# Patient Record
Sex: Female | Born: 1995 | Race: Black or African American | Hispanic: No | Marital: Single | Smoking: Current some day smoker
Health system: Southern US, Community
[De-identification: ages and names within clinical notes are randomized; demographics above are authoritative.]

## PROBLEM LIST (undated history)

## (undated) DIAGNOSIS — A549 Gonococcal infection, unspecified: Secondary | ICD-10-CM

## (undated) DIAGNOSIS — A749 Chlamydial infection, unspecified: Secondary | ICD-10-CM

## (undated) HISTORY — PX: LEG SURGERY: SHX1003

---
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
## 2015-05-21 ENCOUNTER — Emergency Department (HOSPITAL_COMMUNITY)
Admission: EM | Admit: 2015-05-21 | Discharge: 2015-05-21 | Disposition: A | Discharge status: Home / Self Care | Payer: Medicaid Other | Source: Home / Self Care

## 2015-12-11 ENCOUNTER — Emergency Department (HOSPITAL_COMMUNITY)
Admission: EM | Admit: 2015-12-11 | Discharge: 2015-12-11 | Disposition: A | Discharge status: Home / Self Care | Payer: Medicaid Other | Attending: Emergency Medicine | Admitting: Emergency Medicine

## 2015-12-11 ENCOUNTER — Emergency Department (HOSPITAL_COMMUNITY): Payer: Medicaid Other

## 2015-12-11 ENCOUNTER — Encounter (HOSPITAL_COMMUNITY): Payer: Self-pay | Admitting: *Deleted

## 2015-12-11 DIAGNOSIS — R1032 Left lower quadrant pain: Secondary | ICD-10-CM | POA: Diagnosis present

## 2015-12-11 DIAGNOSIS — N83201 Unspecified ovarian cyst, right side: Secondary | ICD-10-CM | POA: Diagnosis not present

## 2015-12-11 DIAGNOSIS — R102 Pelvic and perineal pain: Secondary | ICD-10-CM

## 2015-12-11 LAB — COMPREHENSIVE METABOLIC PANEL
ALBUMIN: 4.5 g/dL (ref 3.5–5.0)
ALT: 22 U/L (ref 14–54)
AST: 23 U/L (ref 15–41)
Alkaline Phosphatase: 65 U/L (ref 38–126)
Anion gap: 7 (ref 5–15)
BUN: 10 mg/dL (ref 6–20)
CHLORIDE: 106 mmol/L (ref 101–111)
CO2: 24 mmol/L (ref 22–32)
CREATININE: 0.91 mg/dL (ref 0.44–1.00)
Calcium: 9.6 mg/dL (ref 8.9–10.3)
GFR calc non Af Amer: >60 mL/min (ref >60)
GLUCOSE: 111 mg/dL — AB (ref 65–99)
Potassium: 4.3 mmol/L (ref 3.5–5.1)
SODIUM: 137 mmol/L (ref 135–145)
Total Bilirubin: 0.8 mg/dL (ref 0.3–1.2)
Total Protein: 7.6 g/dL (ref 6.5–8.1)

## 2015-12-11 LAB — URINALYSIS, ROUTINE W REFLEX MICROSCOPIC
GLUCOSE, UA: NEGATIVE mg/dL
HGB URINE DIPSTICK: NEGATIVE
Ketones, ur: NEGATIVE mg/dL
Nitrite: NEGATIVE
Protein, ur: NEGATIVE mg/dL
SPECIFIC GRAVITY, URINE: 1.031 — AB (ref 1.005–1.030)
pH: 6 (ref 5.0–8.0)

## 2015-12-11 LAB — CBC
HCT: 40.8 % (ref 36.0–46.0)
Hemoglobin: 13.6 g/dL (ref 12.0–15.0)
MCH: 27.3 pg (ref 26.0–34.0)
MCHC: 33.3 g/dL (ref 30.0–36.0)
MCV: 81.8 fL (ref 78.0–100.0)
PLATELETS: 284 10*3/uL (ref 150–400)
RBC: 4.99 MIL/uL (ref 3.87–5.11)
RDW: 14.1 % (ref 11.5–15.5)
WBC: 9.6 10*3/uL (ref 4.0–10.5)

## 2015-12-11 LAB — I-STAT BETA HCG BLOOD, ED (MC, WL, AP ONLY): I-stat hCG, quantitative: <5 m[IU]/mL (ref <5)

## 2015-12-11 LAB — LIPASE, BLOOD: LIPASE: 20 U/L (ref 11–51)

## 2015-12-11 LAB — URINE MICROSCOPIC-ADD ON: RBC / HPF: NONE SEEN RBC/hpf (ref 0–5)

## 2015-12-11 MED ORDER — IBUPROFEN 600 MG PO TABS: 600.0000 mg | ORAL_TABLET | Freq: Four times a day (QID) | ORAL | 0 refills | Status: DC | PRN

## 2015-12-11 MED ORDER — HYDROCODONE-ACETAMINOPHEN 5-325 MG PO TABS: 1.0000 | ORAL_TABLET | ORAL | 0 refills | Status: DC | PRN

## 2015-12-11 NOTE — ED Notes (Signed)
Pt ambulatory and independent at discharge.  Verbalized understanding of discharge instructions 

## 2015-12-11 NOTE — ED Provider Notes (Signed)
[facility]-EMERGENCY DEPT Provider Note   CSN: [id number] Arrival date & time: 12/11/15  1421     History   Chief Complaint Chief Complaint  Patient presents with  . Abdominal Pain    HPI Rachel Lamb is a 20 y.o. female.  20 year old female presents acute onset of left lower quadrant abdominal pain was last for approximately 2 hours. Pain characterized as sharp and nonradiating. No associated vaginal bleeding or discharge. Denies any flank pain. No hematuria or dysuria. No diarrhea noted. Did pass gas which did not seem to relieve her symptoms. Denies history of constipation. Symptoms resolve spontaneously. Feels better at this time      History reviewed. No pertinent past medical history.  There are no active problems to display for this patient.   Past Surgical History:  Procedure Laterality Date  . LEG SURGERY      OB History    No data available       Home Medications    Prior to Admission medications   Medication Sig Start Date End Date Taking? Authorizing Provider  methylphenidate (CONCERTA) 36 MG CR tablet Take 36 mg by mouth every morning.    Leata MouseJanardhana Jonnalagadda, MD    Family History No family history on file.  Social History Social History  Substance Use Topics  . Smoking status: Never Smoker  . Smokeless tobacco: Never Used  . Alcohol use No     Allergies   Iodine   Review of Systems Review of Systems  All other systems reviewed and are negative.    Physical Exam Updated Vital Signs BP 124/69 (BP Location: Right Arm)   Pulse 86   Temp 97.9 F (36.6 C) (Oral)   Resp 18   LMP 11/19/2015   SpO2 100%   Physical Exam  Constitutional: She is oriented to person, place, and time. She appears well-developed and well-nourished.  Non-toxic appearance. No distress.  HENT:  Head: Normocephalic and atraumatic.  Eyes: Conjunctivae, EOM and lids are normal. Pupils are equal, round, and reactive to light.  Neck: Normal range of motion. Neck  supple. No tracheal deviation present. No thyroid mass present.  Cardiovascular: Normal rate, regular rhythm and normal heart sounds.  Exam reveals no gallop.   No murmur heard. Pulmonary/Chest: Effort normal and breath sounds normal. No stridor. No respiratory distress. She has no decreased breath sounds. She has no wheezes. She has no rhonchi. She has no rales.  Abdominal: Soft. Normal appearance and bowel sounds are normal. She exhibits no distension. There is tenderness in the left lower quadrant. There is no rigidity, no rebound, no guarding and no CVA tenderness.    Musculoskeletal: Normal range of motion. She exhibits no edema or tenderness.  Neurological: She is alert and oriented to person, place, and time. She has normal strength. No cranial nerve deficit or sensory deficit. GCS eye subscore is 4. GCS verbal subscore is 5. GCS motor subscore is 6.  Skin: Skin is warm and dry. No abrasion and no rash noted.  Psychiatric: She has a normal mood and affect. Her speech is normal and behavior is normal.  Nursing note and vitals reviewed.    ED Treatments / Results  Labs (all labs ordered are listed, but only abnormal results are displayed) Labs Reviewed  COMPREHENSIVE METABOLIC PANEL - Abnormal; Notable for the following:       Result Value   Glucose, Bld 111 (*)    All other components within normal limits  URINE CULTURE  LIPASE, BLOOD  CBC  URINALYSIS, ROUTINE W REFLEX MICROSCOPIC (NOT AT [facility])  I-STAT BETA HCG BLOOD, ED ([facility], [facility], [facility] ONLY)    EKG  EKG Interpretation None       Radiology No results found.  Procedures Procedures (including critical care time)  Medications Ordered in ED Medications - No data to display   Initial Impression / Assessment and Plan / ED Course  I have reviewed the triage vital signs and the nursing notes.  Pertinent labs & imaging results that were available during my care of the patient were reviewed by me and considered in my medical  decision making (see chart for details).  Clinical Course    Patient's ultrasound consistent with ruptured ovarian cyst. She is deferring a pelvic exam at this time. Will be given Gyn referral  Final Clinical Impressions(s) / ED Diagnoses   Final diagnoses:  None    New Prescriptions New Prescriptions   No medications on file     Lorre Nick, MD 12/11/15 2208

## 2015-12-11 NOTE — ED Triage Notes (Signed)
Per ems, pt complains of lower abdominal pain since this morning. Pt has not had diarrhea/emesis. Pt states she has had lack of appetite today.

## 2015-12-13 LAB — URINE CULTURE

## 2016-01-05 ENCOUNTER — Emergency Department (HOSPITAL_COMMUNITY)
Admission: EM | Admit: 2016-01-05 | Discharge: 2016-01-06 | Disposition: A | Discharge status: Home / Self Care | Payer: Medicaid Other | Attending: Emergency Medicine | Admitting: Emergency Medicine

## 2016-01-05 ENCOUNTER — Encounter (HOSPITAL_COMMUNITY): Payer: Self-pay | Admitting: Oncology

## 2016-01-05 DIAGNOSIS — R140 Abdominal distension (gaseous): Secondary | ICD-10-CM | POA: Insufficient documentation

## 2016-01-05 DIAGNOSIS — R1011 Right upper quadrant pain: Secondary | ICD-10-CM | POA: Diagnosis present

## 2016-01-05 DIAGNOSIS — Z79899 Other long term (current) drug therapy: Secondary | ICD-10-CM | POA: Insufficient documentation

## 2016-01-05 LAB — COMPREHENSIVE METABOLIC PANEL
ALK PHOS: 72 U/L (ref 38–126)
ALT: 20 U/L (ref 14–54)
AST: 21 U/L (ref 15–41)
Albumin: 3.9 g/dL (ref 3.5–5.0)
Anion gap: 10 (ref 5–15)
BUN: 9 mg/dL (ref 6–20)
CHLORIDE: 103 mmol/L (ref 101–111)
CO2: 23 mmol/L (ref 22–32)
CREATININE: 0.86 mg/dL (ref 0.44–1.00)
Calcium: 9.2 mg/dL (ref 8.9–10.3)
GFR calc Af Amer: >60 mL/min (ref >60)
Glucose, Bld: 120 mg/dL — ABNORMAL HIGH (ref 65–99)
Potassium: 4 mmol/L (ref 3.5–5.1)
Sodium: 136 mmol/L (ref 135–145)
Total Bilirubin: 0.9 mg/dL (ref 0.3–1.2)
Total Protein: 7.6 g/dL (ref 6.5–8.1)

## 2016-01-05 LAB — CBC
HCT: 32.6 % — ABNORMAL LOW (ref 36.0–46.0)
Hemoglobin: 11.9 g/dL — ABNORMAL LOW (ref 12.0–15.0)
MCH: 28.6 pg (ref 26.0–34.0)
MCHC: 36.5 g/dL — ABNORMAL HIGH (ref 30.0–36.0)
MCV: 78.4 fL (ref 78.0–100.0)
PLATELETS: 595 10*3/uL — AB (ref 150–400)
RBC: 4.16 MIL/uL (ref 3.87–5.11)
RDW: 14.1 % (ref 11.5–15.5)
WBC: 29.9 10*3/uL — AB (ref 4.0–10.5)

## 2016-01-05 LAB — URINALYSIS, ROUTINE W REFLEX MICROSCOPIC
GLUCOSE, UA: NEGATIVE mg/dL
Nitrite: NEGATIVE
PROTEIN: 30 mg/dL — AB
Specific Gravity, Urine: 1.028 (ref 1.005–1.030)
pH: 6 (ref 5.0–8.0)

## 2016-01-05 LAB — LIPASE, BLOOD: LIPASE: 17 U/L (ref 11–51)

## 2016-01-05 LAB — URINE MICROSCOPIC-ADD ON

## 2016-01-05 NOTE — ED Triage Notes (Signed)
Per [facility] pt c/o RUQ abdominal pain since yesterday.  +Nausea.  Pt ambulatory to triage.  A&O x 4.

## 2016-01-05 NOTE — ED Provider Notes (Signed)
[facility]-EMERGENCY DEPT Provider Note   CSN: [id number] Arrival date & time: 01/05/16  1958  By signing my name below, I, Linna Darner, attest that this documentation has been prepared under the direction and in the presence [employer], PA-C. Electronically Signed: Linna Darner, Scribe. 01/06/2016. 12:13 AM.  History   Chief Complaint Chief Complaint  Patient presents with  . Abdominal Pain    The history is provided by the patient. No language interpreter was used.    HPI Comments: Rachel Lamb is a 20 y.o. female who presents to the Emergency Department complaining of sudden onset, constant, RUQ abdominal pain beginning 2 days ago. She reports her pain became severe yesterday. Pt reports her pain is worse with deep inhalation, ambulation, laying down, and palpation to her RUQ. Pt denies pain exacerbation with eating. She notes some pain alleviation with sitting upright. She notes she last had a BM two days ago and endorses abdominal pain exacerbation when she tries to have a BM. She states she usually has regular BM's. Pt has tried resting with no relief of her pain. She also notes she has used hydrocodone and ibuprofen but regurgitated the medications; she had these medications leftover from a visit for ovarian cysts and notes her current pain does not feel like ovarian cysts. Pt states she did not eat yesterday. Her LMP was at the beginning of the month. She denies h/o abdominal surgery. She denies recent heavy lifting. Pt further denies dysuria, hematuria, fever, vaginal pain, vaginal discharge, or any other associated symptoms.  History reviewed. No pertinent past medical history.  There are no active problems to display for this patient.   Past Surgical History:  Procedure Laterality Date  . LEG SURGERY      OB History    No data available       Home Medications    Prior to Admission medications   Medication Sig Start Date End Date Taking? Authorizing Provider    HYDROcodone-acetaminophen (NORCO/VICODIN) 5-325 MG tablet Take 1-2 tablets by mouth every 4 (four) hours as needed. 12/11/15  Yes Lorre Nick, MD  ibuprofen (ADVIL,MOTRIN) 600 MG tablet Take 1 tablet (600 mg total) by mouth every 6 (six) hours as needed. 12/11/15  Yes Lorre Nick, MD  polyethylene glycol powder (GLYCOLAX/MIRALAX) powder Take 17 g by mouth daily. Until daily soft stools  OTC 01/06/16   Antony Madura, PA-C    Family History No family history on file.  Social History Social History  Substance Use Topics  . Smoking status: Never Smoker  . Smokeless tobacco: Never Used  . Alcohol use No     Allergies   Iodine   Review of Systems Review of Systems A complete 10 system review of systems was obtained and all systems are negative except as noted in the HPI and PMH.    Physical Exam Updated Vital Signs BP 105/69   Pulse 84   Temp 98.5 F (36.9 C) (Oral)   Resp 18   Ht 4\' 11"  (1.499 m)   Wt 61.2 kg   LMP 12/31/2015 (Approximate) Comment: negative pregnancy test result.  SpO2 100%   BMI 27.27 kg/m   Physical Exam  Constitutional: She is oriented to person, place, and time. She appears well-developed and well-nourished. No distress.  Nontoxic appearing and in NAD  HENT:  Head: Normocephalic and atraumatic.  Eyes: Conjunctivae and EOM are normal. No scleral icterus.  Neck: Normal range of motion.  Cardiovascular: Normal rate, regular rhythm and intact distal pulses.  Pulmonary/Chest: Effort normal. No respiratory distress. She has no wheezes.  Respirations even and unlabored.  Abdominal: She exhibits distension. There is tenderness. There is guarding.  Mildly distended abdomen with diffuse TTP, though appreciate more significant TTP in the RUQ. There is diffuse voluntary guarding. No masses palpable.  Musculoskeletal: Normal range of motion.  Neurological: She is alert and oriented to person, place, and time. She exhibits normal muscle tone. Coordination  normal.  Skin: Skin is warm and dry. No rash noted. She is not diaphoretic. No erythema. No pallor.  Psychiatric: She has a normal mood and affect. Her behavior is normal.  Nursing note and vitals reviewed.    ED Treatments / Results  Labs (all labs ordered are listed, but only abnormal results are displayed) Labs Reviewed  COMPREHENSIVE METABOLIC PANEL - Abnormal; Notable for the following:       Result Value   Glucose, Bld 120 (*)    All other components within normal limits  CBC - Abnormal; Notable for the following:    WBC 29.9 (*)    Hemoglobin 11.9 (*)    HCT 32.6 (*)    MCHC 36.5 (*)    Platelets 595 (*)    All other components within normal limits  URINALYSIS, ROUTINE W REFLEX MICROSCOPIC (NOT AT [facility]) - Abnormal; Notable for the following:    Color, Urine AMBER (*)    APPearance CLOUDY (*)    Hgb urine dipstick SMALL (*)    Bilirubin Urine SMALL (*)    Ketones, ur >80 (*)    Protein, ur 30 (*)    Leukocytes, UA MODERATE (*)    All other components within normal limits  URINE MICROSCOPIC-ADD ON - Abnormal; Notable for the following:    Squamous Epithelial / LPF 0-5 (*)    Bacteria, UA FEW (*)    All other components within normal limits  LIPASE, BLOOD  PREGNANCY, URINE    EKG  EKG Interpretation None       Radiology Ct Abdomen Pelvis Wo Contrast  Result Date: 01/06/2016 CLINICAL DATA:  Sudden onset right upper quadrant abdominal pain for 2 days. EXAM: CT ABDOMEN AND PELVIS WITHOUT CONTRAST TECHNIQUE: Multidetector CT imaging of the abdomen and pelvis was performed following the standard protocol without IV contrast. COMPARISON:  None. FINDINGS: Lower chest: Trace bilateral pleural effusions. Lung bases are clear. Hepatobiliary: Unenhanced appearance is unremarkable. Pancreas: Unenhanced appearance is unremarkable. Spleen: Unenhanced appearance is unremarkable. Adrenals/Urinary Tract: Adrenal glands are unremarkable. Kidneys are normal, without renal calculi,  focal lesion, or hydronephrosis. Bladder is unremarkable. Stomach/Bowel: Stomach, small bowel, and colon are not abnormally distended. Most of the contrast material remains in the stomach, possibly indicating decreased motility. No inflammatory changes suggested. Appendix is normal. Vascular/Lymphatic: No significant vascular findings are present. No enlarged abdominal or pelvic lymph nodes. Reproductive: Uterus and bilateral adnexa are unremarkable. Other: No abdominal wall hernia or abnormality. No abdominopelvic ascites. Musculoskeletal: No acute or significant osseous findings. IMPRESSION: No acute process demonstrated in the abdomen or pelvis. No evidence of bowel obstruction or inflammation. Most of the oral contrast material remains in the stomach, possibly indicating hypomotility. Trace bilateral pleural effusions. Electronically Signed   By: Burman NievesWilliam  Stevens M.D.   On: 01/06/2016 02:23   Dg Abd 1 View  Result Date: 01/06/2016 CLINICAL DATA:  Right upper quadrant pain, nausea and vomiting since last night. Ovarian cyst rupture 2-3 weeks ago. EXAM: ABDOMEN - 1 VIEW COMPARISON:  None. FINDINGS: Gas throughout the colon without distention. No  small bowel distention. No free intra-abdominal air. No abnormal air-fluid levels. No radiopaque stones. Visualized bones appear intact. IMPRESSION: Nonobstructive bowel gas pattern. Electronically Signed   By: Burman Nieves M.D.   On: 01/06/2016 01:32   US Abdomen Limited Ruq  Result Date: 01/06/2016 CLINICAL DATA:  Right upper quadrant pain for 2 days. EXAM: US ABDOMEN LIMITED - RIGHT UPPER QUADRANT COMPARISON:  CT 01/06/2016 FINDINGS: Gallbladder: No gallstones or wall thickening visualized. No sonographic Murphy sign noted by sonographer. Common bile duct: Diameter: 2.9 mm, normal Liver: No focal lesion identified. Within normal limits in parenchymal echogenicity. IMPRESSION: Normal examination. Electronically Signed   By: Burman Nieves M.D.   On:  01/06/2016 03:20    Procedures Procedures (including critical care time)  DIAGNOSTIC STUDIES: Oxygen Saturation is 99% on RA, normal by my interpretation.    COORDINATION OF CARE: 12:20 AM Discussed treatment plan with pt at bedside and pt agreed to plan.  Medications Ordered in ED Medications  sodium chloride 0.9 % bolus 1,000 mL (0 mLs Intravenous Stopped 01/06/16 0233)  ondansetron (ZOFRAN) injection 4 mg (4 mg Intravenous Given 01/06/16 0048)  morphine 4 MG/ML injection 4 mg (4 mg Intravenous Given 01/06/16 0048)  iopamidol (ISOVUE-300) 61 % injection 15 mL (15 mLs Oral Contrast Given 01/06/16 0051)     Initial Impression / Assessment and Plan / ED Course  I have reviewed the triage vital signs and the nursing notes.  Pertinent labs & imaging results that were available during my care of the patient were reviewed by me and considered in my medical decision making (see chart for details).  Clinical Course    20 y/o female presents for abdominal pain. Initial exam of the abdomen concerning; diffuse TTP appreciated as well as voluntary guarding. Mild distension noted. Patient afebrile, but with leukocytosis of 29.9. Given exam and white count, CT ordered to evaluate etiology of symptoms. Unable to do contrasted study secondary to contrast allergy. No abnormalities appreciated on oral contrasted CT study. [place] was subsequently obtained given increased TTP in the RUQ. This was negative for acute or surgical gallbladder etiology.  On reassessment, patient states that she is feeling better. She states that she passed a large amount of gas and this significantly improved her discomfort. Patient with stable vital signs. No SIRS or Sepsis criteria. She is nontoxic appearing. Given reassuring imaging, plan for discharge and outpatient management. Will start on Miralax. Return precautions discussed and provided. Patient discharged in stable condition with no unaddressed concerns. Patient seen and  examined, also, by my attending, Dr. Read Drivers, who is in agreement with this work up, assessment, management plan, and patient's stability for discharge.   Vitals:   01/06/16 0300 01/06/16 0330 01/06/16 0400 01/06/16 0430  BP: 108/65 120/69 104/66 105/69  Pulse: 89 88 80 84  Resp:      Temp:      TempSrc:      SpO2: 98% 97% 99% 100%  Weight:      Height:        Final Clinical Impressions(s) / ED Diagnoses   Final diagnoses:  Right upper quadrant abdominal pain    New Prescriptions Discharge Medication List as of 01/06/2016  4:23 AM    START taking these medications   Details  polyethylene glycol powder (GLYCOLAX/MIRALAX) powder Take 17 g by mouth daily. Until daily soft stools  OTC, Starting Sat 01/06/2016, Print        I personally performed the services described in this documentation, which was  scribed in my presence. The recorded information has been reviewed and is accurate.      Antony Madura, PA-C 01/09/16 1610    Paula Libra, MD 01/09/16 [phone]

## 2016-01-06 ENCOUNTER — Emergency Department (HOSPITAL_COMMUNITY): Payer: Medicaid Other

## 2016-01-06 LAB — PREGNANCY, URINE: PREG TEST UR: NEGATIVE

## 2016-01-06 MED ORDER — POLYETHYLENE GLYCOL 3350 17 GM/SCOOP PO POWD: 17.0000 g | Freq: Every day | ORAL | 0 refills | Status: DC

## 2016-01-06 MED ORDER — SODIUM CHLORIDE 0.9 % IV BOLUS (SEPSIS)
1000.0000 mL | Freq: Once | INTRAVENOUS | Status: AC
  Administered 2016-01-06: 1000 mL via INTRAVENOUS

## 2016-01-06 MED ORDER — MORPHINE SULFATE (PF) 4 MG/ML IV SOLN
4.0000 mg | Freq: Once | INTRAVENOUS | Status: AC
Start: 2016-01-06 — End: 2016-01-06
  Administered 2016-01-06: 4 mg via INTRAVENOUS
  Filled 2016-01-06: qty 1

## 2016-01-06 MED ORDER — ONDANSETRON HCL 4 MG/2ML IJ SOLN
4.0000 mg | Freq: Once | INTRAMUSCULAR | Status: AC
  Administered 2016-01-06: 4 mg via INTRAVENOUS
  Filled 2016-01-06: qty 2

## 2016-01-06 MED ORDER — IOPAMIDOL (ISOVUE-300) INJECTION 61%
15.0000 mL | Freq: Once | INTRAVENOUS | Status: AC | PRN
Start: 2016-01-06 — End: 2016-01-06
  Administered 2016-01-06: 15 mL via ORAL

## 2016-02-12 ENCOUNTER — Ambulatory Visit (HOSPITAL_COMMUNITY)
Admission: EM | Admit: 2016-02-12 | Discharge: 2016-02-12 | Disposition: A | Discharge status: Home / Self Care | Payer: Medicaid Other | Attending: Emergency Medicine | Admitting: Emergency Medicine

## 2016-02-12 ENCOUNTER — Encounter (HOSPITAL_COMMUNITY): Payer: Self-pay | Admitting: Emergency Medicine

## 2016-02-12 DIAGNOSIS — R10813 Right lower quadrant abdominal tenderness: Secondary | ICD-10-CM | POA: Insufficient documentation

## 2016-02-12 DIAGNOSIS — Z202 Contact with and (suspected) exposure to infections with a predominantly sexual mode of transmission: Secondary | ICD-10-CM | POA: Diagnosis present

## 2016-02-12 DIAGNOSIS — R102 Pelvic and perineal pain: Secondary | ICD-10-CM

## 2016-02-12 DIAGNOSIS — R1021 Pelvic and perineal pain right side: Secondary | ICD-10-CM

## 2016-02-12 LAB — POCT URINALYSIS DIP (DEVICE)
BILIRUBIN URINE: NEGATIVE
Glucose, UA: NEGATIVE mg/dL
Nitrite: NEGATIVE
Protein, ur: 30 mg/dL — AB
Urobilinogen, UA: 0.2 mg/dL (ref 0.0–1.0)
pH: 6 (ref 5.0–8.0)

## 2016-02-12 LAB — POCT PREGNANCY, URINE: Preg Test, Ur: NEGATIVE

## 2016-02-12 MED ORDER — LIDOCAINE HCL (PF) 1 % IJ SOLN
INTRAMUSCULAR | Status: AC
  Filled 2016-02-12: qty 2

## 2016-02-12 MED ORDER — AZITHROMYCIN 250 MG PO TABS
ORAL_TABLET | ORAL | Status: AC
  Filled 2016-02-12: qty 4

## 2016-02-12 MED ORDER — CEFTRIAXONE SODIUM 250 MG IJ SOLR
INTRAMUSCULAR | Status: AC
  Filled 2016-02-12: qty 250

## 2016-02-12 MED ORDER — CEFTRIAXONE SODIUM 250 MG IJ SOLR
250.0000 mg | Freq: Once | INTRAMUSCULAR | Status: AC
  Administered 2016-02-12: 250 mg via INTRAMUSCULAR

## 2016-02-12 MED ORDER — AZITHROMYCIN 250 MG PO TABS
1000.0000 mg | ORAL_TABLET | Freq: Once | ORAL | Status: AC
  Administered 2016-02-12: 1000 mg via ORAL

## 2016-02-12 NOTE — ED Triage Notes (Signed)
Pain under ribs-seen in emergency department.  Pain with any movement.  Told "backed up with gas" per patient.  Patient wants preg test.  And patient was told she needed to be checked for std.  Denies vaginal discharge, denies painful urinations

## 2016-02-12 NOTE — ED Provider Notes (Signed)
CSN: [id number]     Arrival date & time 02/12/16  1310 History   None    Chief Complaint  Patient presents with  . SEXUALLY TRANSMITTED DISEASE   (Consider location/radiation/quality/duration/timing/severity/associated sxs/prior Treatment) 20 year old female states that she was advised by her boyfriend that he tested positive for unknown type STD. She wants to be checked for STD as well as have a pregnancy test. She believes her LMP was sometime in October but she does not really keep track of it. She is currently asymptomatic. She has no complaints. Her pregnancy test is currently negative. She denies urinary symptoms.      History reviewed. No pertinent past medical history. Past Surgical History:  Procedure Laterality Date  . LEG SURGERY     No family history on file. Social History  Substance Use Topics  . Smoking status: Never Smoker  . Smokeless tobacco: Never Used  . Alcohol use No   OB History    No data available     Review of Systems  Constitutional: Negative.   HENT: Negative.   Respiratory: Negative.   Cardiovascular: Negative.   Gastrointestinal: Negative.   Genitourinary: Negative.   Musculoskeletal: Negative.     Allergies  Iodine  Home Medications   Prior to Admission medications   Medication Sig Start Date End Date Taking? Authorizing Provider  HYDROcodone-acetaminophen (NORCO/VICODIN) 5-325 MG tablet Take 1-2 tablets by mouth every 4 (four) hours as needed. 12/11/15   Lorre NickAnthony Allen, MD  ibuprofen (ADVIL,MOTRIN) 600 MG tablet Take 1 tablet (600 mg total) by mouth every 6 (six) hours as needed. 12/11/15   Lorre NickAnthony Allen, MD  polyethylene glycol powder (GLYCOLAX/MIRALAX) powder Take 17 g by mouth daily. Until daily soft stools  OTC 01/06/16   Antony MaduraKelly Humes, PA-C   Meds Ordered and Administered this Visit   Medications  cefTRIAXone (ROCEPHIN) injection 250 mg (not administered)  azithromycin (ZITHROMAX) tablet 1,000 mg (not administered)    BP  101/59 (BP Location: Left Arm)   Pulse 84   Temp 98.4 F (36.9 C) (Oral)   Resp 16   LMP 01/19/2016   SpO2 99%  No data found.   Physical Exam  Constitutional: She is oriented to person, place, and time. She appears well-developed and well-nourished. No distress.  Eyes: EOM are normal.  Neck: Normal range of motion. Neck supple.  Cardiovascular: Normal rate.   Pulmonary/Chest: Effort normal. No respiratory distress.  Genitourinary: Vagina normal.  Genitourinary Comments: Normal external female genitalia. Scant thin gray discharge. Cervix left of midline. Ectocervix pink without lesions. Negative CMT. Positive for right adnexal tenderness. Negative for left adnexal tenderness.  Musculoskeletal: She exhibits no edema.  Neurological: She is alert and oriented to person, place, and time. She exhibits normal muscle tone.  Skin: Skin is warm and dry.  Psychiatric: She has a normal mood and affect.  Nursing note and vitals reviewed.   Urgent Care Course   Clinical Course     Procedures (including critical care time)  Labs Review Labs Reviewed  POCT URINALYSIS DIP (DEVICE) - Abnormal; Notable for the following:       Result Value   Ketones, ur TRACE (*)    Hgb urine dipstick SMALL (*)    Protein, ur 30 (*)    Leukocytes, UA SMALL (*)    All other components within normal limits  POCT PREGNANCY, URINE  CERVICOVAGINAL ANCILLARY ONLY    Imaging Review No results found.   Visual Acuity Review  Right Eye Distance:  Left Eye Distance:   Bilateral Distance:    Right Eye Near:   Left Eye Near:    Bilateral Near:         MDM   1. Exposure to sexually transmitted disease (STD)   2. Adnexal tenderness, right    For any positives that return on the swab test he will be called and treated appropriately, usually over the telephone. Today you have received medicines called Rocephin and azithromycin which treat gonorrhea and chlamydia. Meds ordered this encounter   Medications  . cefTRIAXone (ROCEPHIN) injection 250 mg  . azithromycin (ZITHROMAX) tablet 1,000 mg  Elected not to treat him. Clean for possible BV due to such a scant amount of discharge and otherwise asymptomatic with the exception of right adnexal tenderness. Treat with the above medications. Cervical cytology pending.     Hayden Rasmussenavid Aden Sek, NP 02/12/16 [phone]

## 2016-02-12 NOTE — Discharge Instructions (Signed)
For any positives that return on the swab test he will be called and treated appropriately, usually over the telephone. Today you have received medicines called Rocephin and azithromycin which treat gonorrhea and chlamydia.

## 2016-02-13 LAB — CERVICOVAGINAL ANCILLARY ONLY
CHLAMYDIA, DNA PROBE: POSITIVE — AB
NEISSERIA GONORRHEA: POSITIVE — AB
WET PREP (BD AFFIRM): POSITIVE — AB

## 2016-02-16 ENCOUNTER — Telehealth (HOSPITAL_COMMUNITY): Payer: Self-pay | Admitting: Emergency Medicine

## 2016-02-16 NOTE — Telephone Encounter (Signed)
Called pt and notified of recent lab results Pt ID'd properly... Reports feeling better but still having some vaginal irritation Per her request, called in Flagyl to [facility] ([address]) Adv pt if sx are not getting better to return or to f/u w/PCP Education on safe sex given Also adv pt to notify partner(s) [facility] Pt verb understanding.

## 2016-02-16 NOTE — Telephone Encounter (Signed)
-----   Message from Eustace MooreLaura W Murray, MD sent at 02/14/2016 10:55 PM EST ----- Please let patient and health department know that tests for gonorrhea and chlamydia were positive.  These were treated at the urgent care visit 02/12/16 with rocephin 250mg  injection and zithromax tablets 1g.  Sexual partners need to be notified and tested/treated.   Test for gardnerella (bacterial vaginosis) was also positive.  If having persistent vaginal irritation/discharge, would send rx for metronidazole 500mg  bid x 7d #14 no refills.  Recheck or followup with primary care provider, [facility], for further evaluation as needed.  Note sent to patient's MyChart.  LM

## 2016-05-06 ENCOUNTER — Encounter (HOSPITAL_COMMUNITY): Payer: Self-pay | Admitting: Emergency Medicine

## 2016-05-06 ENCOUNTER — Ambulatory Visit (HOSPITAL_COMMUNITY)
Admission: EM | Admit: 2016-05-06 | Discharge: 2016-05-06 | Disposition: A | Discharge status: Home / Self Care | Payer: Medicaid Other | Attending: Internal Medicine | Admitting: Internal Medicine

## 2016-05-06 DIAGNOSIS — R197 Diarrhea, unspecified: Secondary | ICD-10-CM | POA: Diagnosis not present

## 2016-05-06 DIAGNOSIS — K529 Noninfective gastroenteritis and colitis, unspecified: Secondary | ICD-10-CM

## 2016-05-06 DIAGNOSIS — R110 Nausea: Secondary | ICD-10-CM | POA: Diagnosis not present

## 2016-05-06 MED ORDER — ONDANSETRON 4 MG PO TBDP: 4.0000 mg | ORAL_TABLET | Freq: Three times a day (TID) | ORAL | 0 refills | Status: DC | PRN

## 2016-05-06 MED ORDER — HYOSCYAMINE SULFATE 0.125 MG PO TABS: 0.1250 mg | ORAL_TABLET | ORAL | 0 refills | Status: DC | PRN

## 2016-05-06 NOTE — ED Provider Notes (Signed)
CSN: [id number]     Arrival date & time 05/06/16  1612 History   First MD Initiated Contact with Patient 05/06/16 1754     Chief Complaint  Patient presents with  . Abdominal Cramping   (Consider location/radiation/quality/duration/timing/severity/associated sxs/prior Treatment) Patient c/o NVD and abdominal cramping for 3 days.   The history is provided by the patient.  Abdominal Cramping  This is a new problem. The problem occurs constantly. The problem has not changed since onset.Nothing aggravates the symptoms. Nothing relieves the symptoms.    History reviewed. No pertinent past medical history. Past Surgical History:  Procedure Laterality Date  . LEG SURGERY     History reviewed. No pertinent family history. Social History  Substance Use Topics  . Smoking status: Never Smoker  . Smokeless tobacco: Never Used  . Alcohol use No   OB History    No data available     Review of Systems  Constitutional: Negative.   HENT: Negative.   Eyes: Negative.   Respiratory: Negative.   Cardiovascular: Negative.   Gastrointestinal: Positive for diarrhea, nausea and vomiting.  Endocrine: Negative.   Genitourinary: Negative.   Musculoskeletal: Negative.   Skin: Negative.   Allergic/Immunologic: Negative.   Neurological: Negative.   Hematological: Negative.   Psychiatric/Behavioral: Negative.     Allergies  Iodine  Home Medications   Prior to Admission medications   Medication Sig Start Date End Date Taking? Authorizing Provider  hyoscyamine (LEVSIN) 0.125 MG tablet Take 1 tablet (0.125 mg total) by mouth every 4 (four) hours as needed. 05/06/16   Deatra CanterWilliam J Oxford, FNP  ondansetron (ZOFRAN ODT) 4 MG disintegrating tablet Take 1 tablet (4 mg total) by mouth every 8 (eight) hours as needed for nausea or vomiting. 05/06/16   Deatra CanterWilliam J Oxford, FNP   Meds Ordered and Administered this Visit  Medications - No data to display  BP (!) 105/39 (BP Location: Right Arm)   Pulse 64    Temp 98.2 F (36.8 C) (Oral)   Resp 16   SpO2 100%  No data found.   Physical Exam  Constitutional: She appears well-developed and well-nourished.  HENT:  Head: Normocephalic and atraumatic.  Right Ear: External ear normal.  Left Ear: External ear normal.  Mouth/Throat: Oropharynx is clear and moist.  Eyes: Conjunctivae and EOM are normal. Pupils are equal, round, and reactive to light.  Neck: Normal range of motion. Neck supple.  Cardiovascular: Normal rate, regular rhythm and normal heart sounds.   Pulmonary/Chest: Effort normal and breath sounds normal.  Abdominal: Soft. Bowel sounds are normal.  Nursing note and vitals reviewed.   Urgent Care Course     Procedures (including critical care time)  Labs Review Labs Reviewed - No data to display  Imaging Review No results found.   Visual Acuity Review  Right Eye Distance:   Left Eye Distance:   Bilateral Distance:    Right Eye Near:   Left Eye Near:    Bilateral Near:         MDM   1. Gastroenteritis   2. Nausea   3. Diarrhea, unspecified type    Zofran  Levsin   Push po fluids, rest, tylenol and motrin otc prn as directed for fever, arthralgias, and myalgias.  Follow up prn if sx's continue or persist.    Deatra CanterWilliam J Oxford, FNP 05/06/16 [phone]

## 2016-05-06 NOTE — ED Triage Notes (Signed)
The patient presented to the [facility] with a complaint of abdominal cramping with N/V/D x 3 days. 

## 2017-07-23 IMAGING — US US ABDOMEN LIMITED
1 series · 14 of 25 positions shown · non-contrast
Comparison: CT 01/06/2016

CLINICAL DATA: Right upper quadrant pain for 2 days.

EXAM:
US ABDOMEN LIMITED - RIGHT UPPER QUADRANT

[Series 1: us abdomen limited · 0.20mm/px · 14 of 46 slices shown]
[im 1/46]
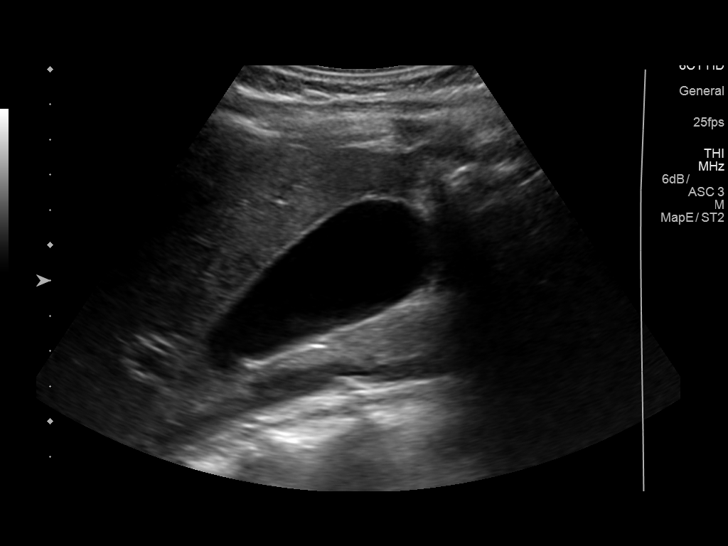
[im 4/46]
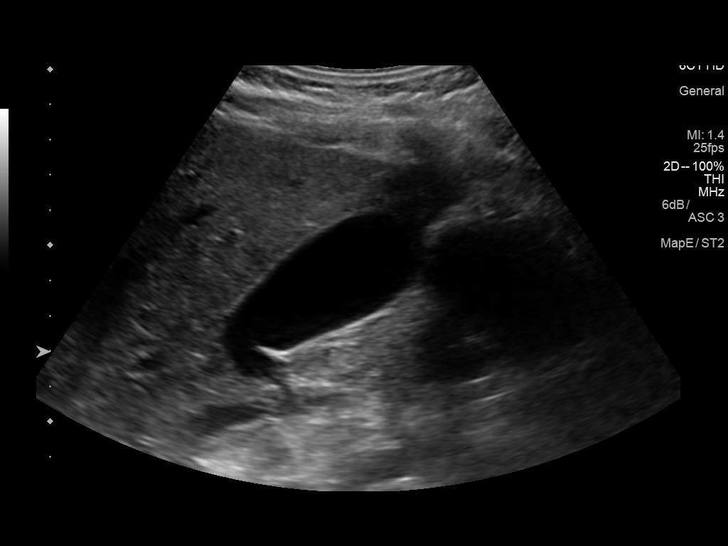
[im 8/46]
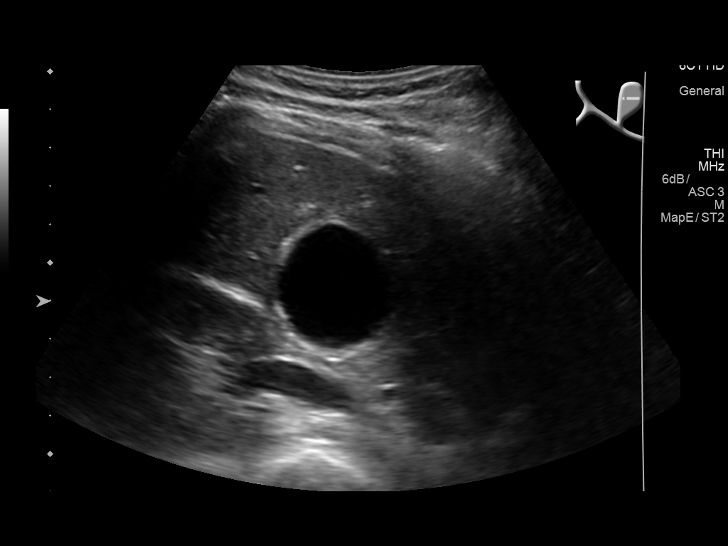
[im 12/46]
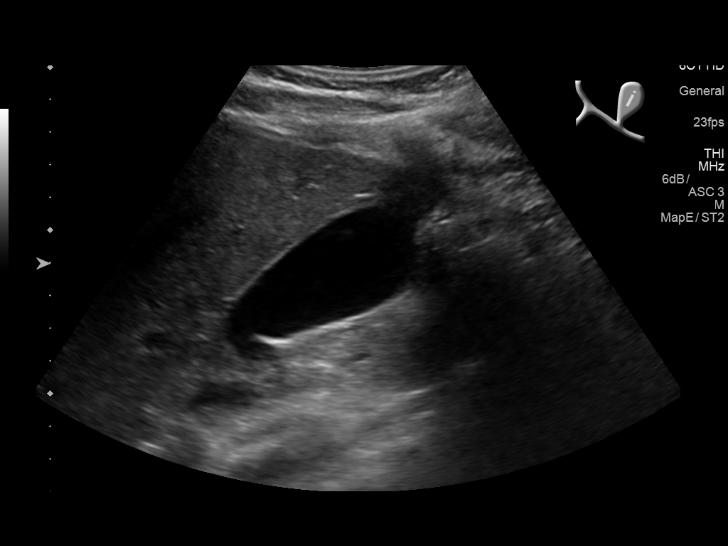
[im 16/46]
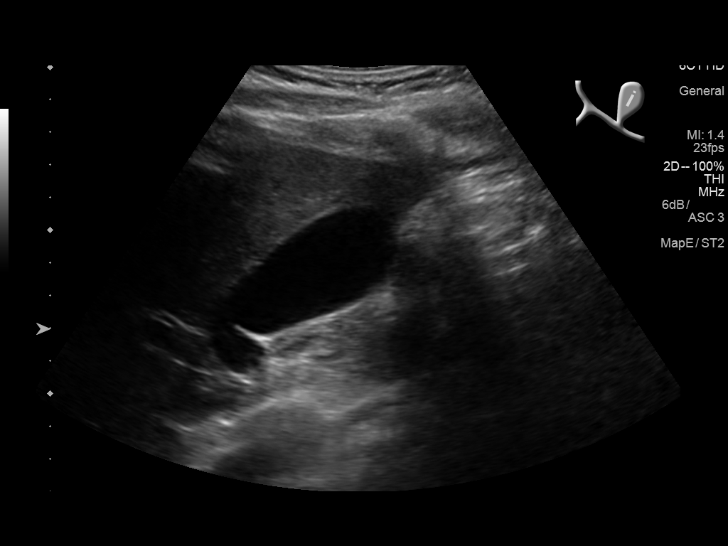
[im 17/46]
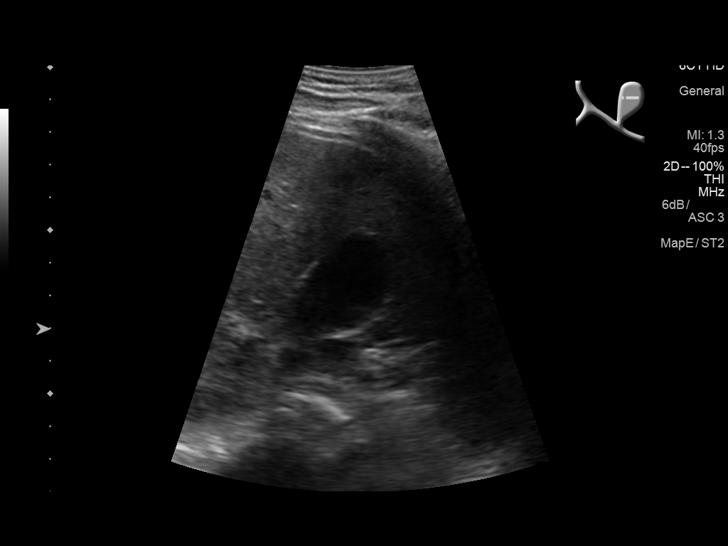
[im 21/46]
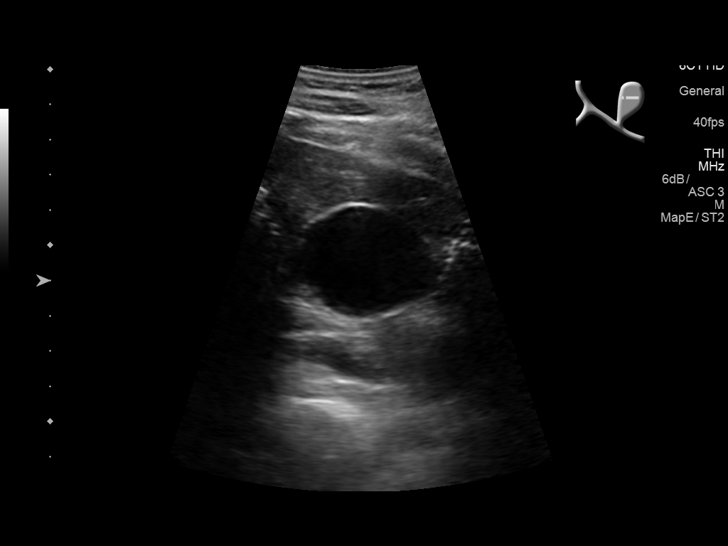
[im 25/46]
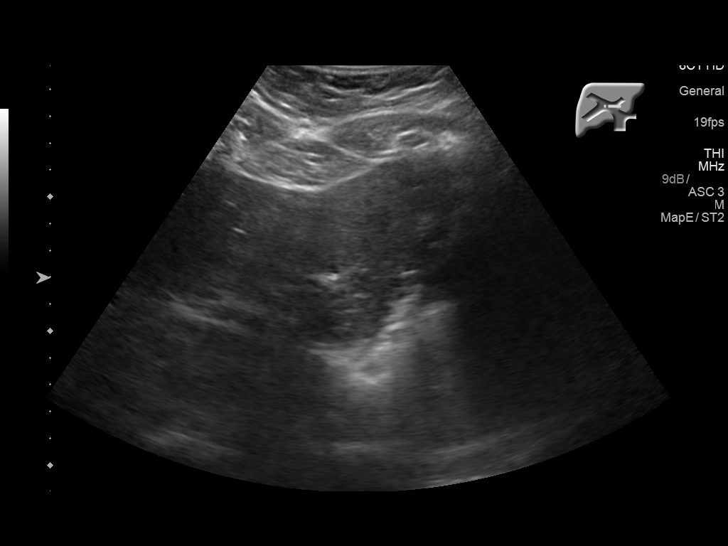
[im 29/46]
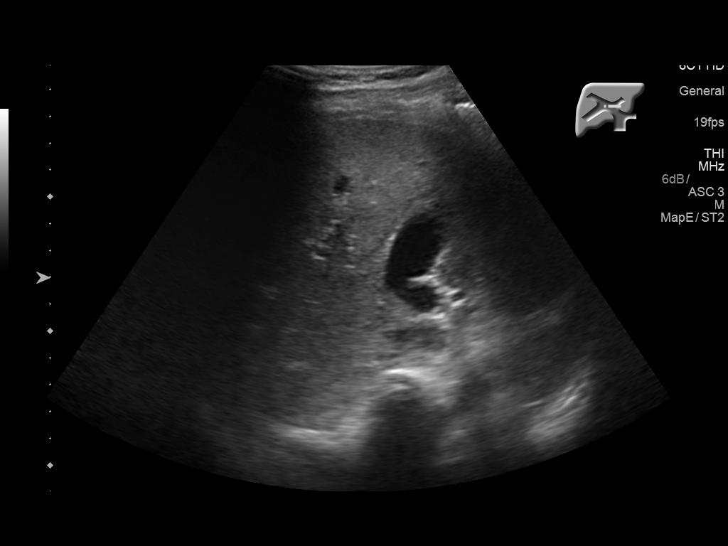
[im 31/46]
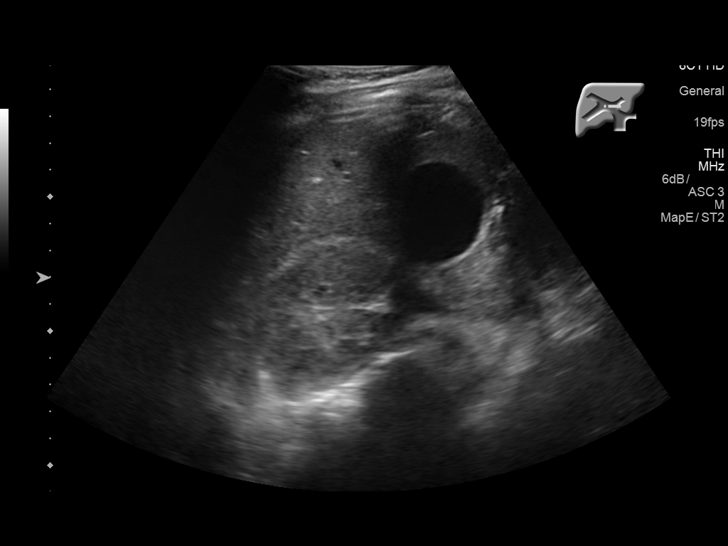
[im 34/46]
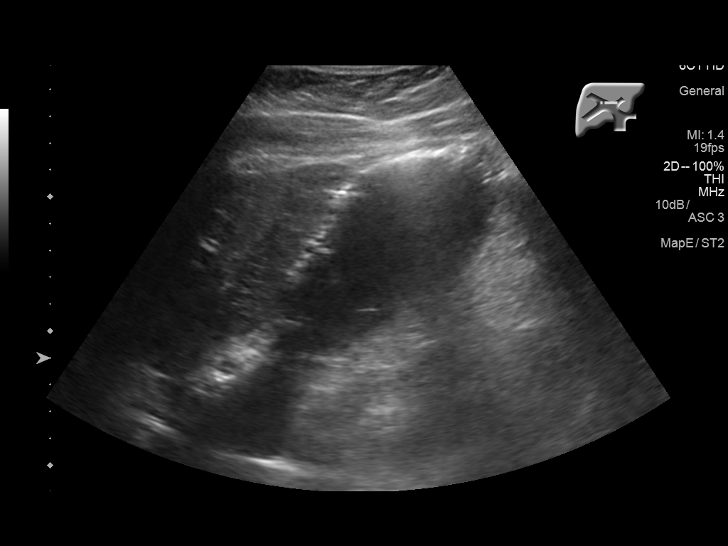
[im 38/46]
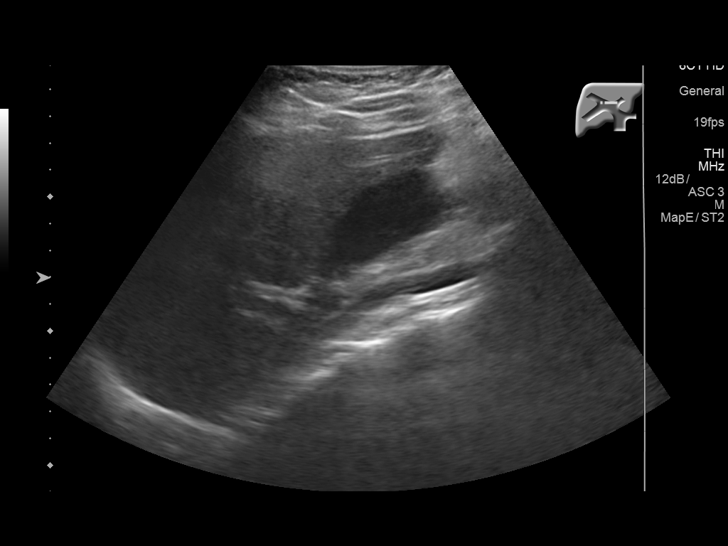
[im 42/46]
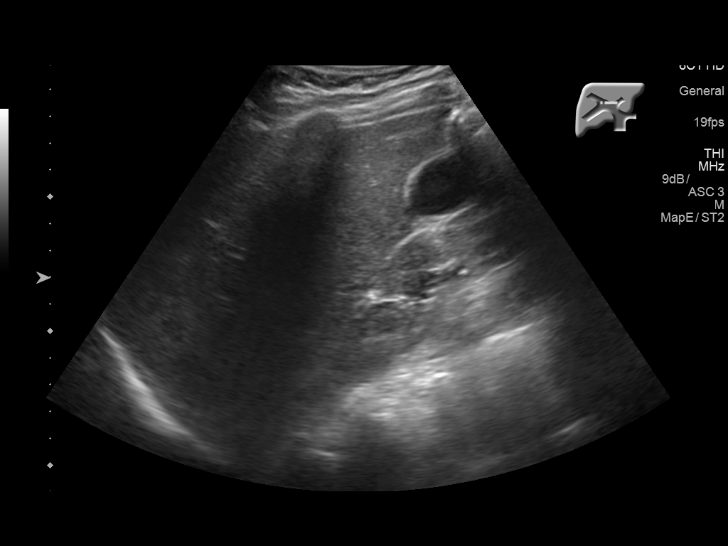
[im 46/46]
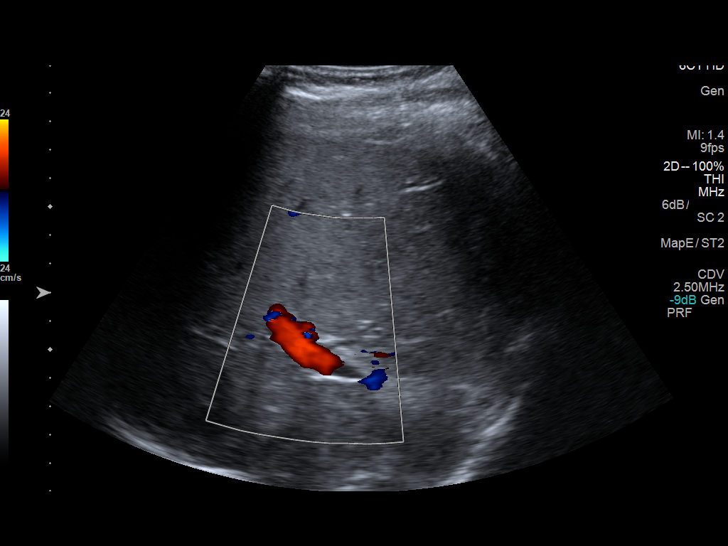

[14 of 25 positions shown; findings below may reference images not displayed]

FINDINGS: Gallbladder:

No gallstones or wall thickening visualized. No sonographic Murphy
sign noted by sonographer.

Common bile duct:

Diameter: 2.9 mm, normal

Liver:

No focal lesion identified. Within normal limits in parenchymal
echogenicity.
IMPRESSION: Normal examination.

## 2017-11-16 ENCOUNTER — Encounter (HOSPITAL_COMMUNITY): Payer: Self-pay | Admitting: *Deleted

## 2017-11-16 ENCOUNTER — Ambulatory Visit (HOSPITAL_COMMUNITY)
Admission: EM | Admit: 2017-11-16 | Discharge: 2017-11-16 | Disposition: A | Discharge status: Home / Self Care | Payer: Self-pay | Attending: Family Medicine | Admitting: Family Medicine

## 2017-11-16 ENCOUNTER — Other Ambulatory Visit: Payer: Self-pay

## 2017-11-16 DIAGNOSIS — N390 Urinary tract infection, site not specified: Secondary | ICD-10-CM

## 2017-11-16 LAB — POCT URINALYSIS DIP (DEVICE)
BILIRUBIN URINE: NEGATIVE
Glucose, UA: NEGATIVE mg/dL
KETONES UR: NEGATIVE mg/dL
Nitrite: NEGATIVE
PH: 6 (ref 5.0–8.0)
Protein, ur: NEGATIVE mg/dL
Urobilinogen, UA: 0.2 mg/dL (ref 0.0–1.0)

## 2017-11-16 LAB — POCT PREGNANCY, URINE: Preg Test, Ur: NEGATIVE

## 2017-11-16 MED ORDER — ONDANSETRON HCL 4 MG PO TABS: 4.0000 mg | ORAL_TABLET | Freq: Four times a day (QID) | ORAL | 0 refills | Status: DC

## 2017-11-16 MED ORDER — CEPHALEXIN 500 MG PO CAPS: 500.0000 mg | ORAL_CAPSULE | Freq: Two times a day (BID) | ORAL | 0 refills | Status: DC

## 2017-11-16 NOTE — ED Triage Notes (Signed)
Reports starting with vomiting 3 days ago along with intermittent pain across mid-abd.  States no menstrual period x "months"; had neg home pregnancy test 2 wks ago.

## 2017-11-16 NOTE — ED Provider Notes (Signed)
[facility]    CSN: [id number] Arrival date & time: 11/16/17  1357     History   Chief Complaint Chief Complaint  Patient presents with  . Emesis    HPI Rachel Lamb is a 22 y.o. female.   HPI  Patient is here for lower abdominal pain, intermittent crampy.  Mild.  She also has vomiting.  Is having a couple times last couple days.  Intermittent.  She is keeping down food and fluids.  Then she will have nausea and throw up for no reason.  No diarrhea or loose bowels.  No fever or chills.  She does have unprotected sex.  She states she "always" has had unprotected sex.  She is had STDs but she is never been pregnant.  She does not use birth control.  She feels it is okay if she becomes pregnant.  Has no breast tenderness, or other signs of pregnancy.  She has irregular menses with the last period months ago.  History reviewed. No pertinent past medical history.  There are no active problems to display for this patient.   Past Surgical History:  Procedure Laterality Date  . LEG SURGERY      OB History   None      Home Medications    Prior to Admission medications   Medication Sig Start Date End Date Taking? Authorizing Provider  cephALEXin (KEFLEX) 500 MG capsule Take 1 capsule (500 mg total) by mouth 2 (two) times daily. 11/16/17   Eustace MooreNelson, Yvonne Sue, MD  ondansetron (ZOFRAN) 4 MG tablet Take 1 tablet (4 mg total) by mouth every 6 (six) hours. 11/16/17   Eustace MooreNelson, Yvonne Sue, MD    Family History Family History  Problem Relation Age of Onset  . Healthy Mother   . Deep vein thrombosis Father     Social History Social History   Tobacco Use  . Smoking status: Current Some Day Smoker    Types: Cigars  . Smokeless tobacco: Never Used  Substance Use Topics  . Alcohol use: No  . Drug use: Never     Allergies   Iodine   Review of Systems Review of Systems  Constitutional: Negative for chills and fever.  HENT: Negative for ear pain and sore throat.    Eyes: Negative for pain and visual disturbance.  Respiratory: Negative for cough and shortness of breath.   Cardiovascular: Negative for chest pain and palpitations.  Gastrointestinal: Positive for abdominal pain, nausea and vomiting. Negative for diarrhea.  Genitourinary: Negative for dysuria and hematuria.  Musculoskeletal: Negative for arthralgias and back pain.  Skin: Negative for color change and rash.  Neurological: Negative for seizures and syncope.  All other systems reviewed and are negative.    Physical Exam Triage Vital Signs ED Triage Vitals  Enc Vitals Group     BP 11/16/17 1440 114/70     Pulse Rate 11/16/17 1440 (!) 53     Resp 11/16/17 1440 16     Temp 11/16/17 1440 (!) 97.5 F (36.4 C)     Temp Source 11/16/17 1440 Oral     SpO2 11/16/17 1440 100 %     Weight --      Height --      Head Circumference --      Peak Flow --      Pain Score 11/16/17 1441 0     Pain Loc --      Pain Edu? --      Excl. in GC? --  No data found.  Updated Vital Signs BP 114/70   Pulse (!) 53   Temp (!) 97.5 F (36.4 C) (Oral)   Resp 16   SpO2 100%       Physical Exam  Constitutional: She appears well-developed and well-nourished. No distress.  HENT:  Head: Normocephalic and atraumatic.  Mouth/Throat: Oropharynx is clear and moist.  Eyes: Pupils are equal, round, and reactive to light. Conjunctivae are normal.  Neck: Normal range of motion.  Cardiovascular: Normal rate and regular rhythm.  Pulmonary/Chest: Effort normal. No respiratory distress.  Abdominal: Soft. Bowel sounds are normal. She exhibits no distension. There is no tenderness.  Musculoskeletal: Normal range of motion. She exhibits no edema.  Neurological: She is alert.  Skin: Skin is warm and dry.  Psychiatric: She has a normal mood and affect. Her behavior is normal.     UC Treatments / Results  Labs (all labs ordered are listed, but only abnormal results are displayed) Labs Reviewed  POCT  URINALYSIS DIP (DEVICE) - Abnormal; Notable for the following components:      Result Value   Hgb urine dipstick MODERATE (*)    Leukocytes, UA TRACE (*)    All other components within normal limits  POCT PREGNANCY, URINE   Regnancy test is negative EKG None  Radiology No results found.  Procedures Procedures (including critical care time)  Medications Ordered in UC Medications - No data to display  Initial Impression / Assessment and Plan / UC Course  I have reviewed the triage vital signs and the nursing notes.  Pertinent labs & imaging results that were available during my care of the patient were reviewed by me and considered in my medical decision making (see chart for details).     Discussed my recommendation that she go to GYN for evaluation of her irregular menses and infertility.  Discussed my recommendation that she use some form of birth control, and that she use condoms to prevent STD. Final Clinical Impressions(s) / UC Diagnoses   Final diagnoses:  Lower urinary tract infection     Discharge Instructions     You have a bladder infection Drink plenty of fluids Take Zofran as needed for any nausea and vomiting Take the Keflex twice a day as antibiotic Return if you fail to improve over the next several days   ED Prescriptions    Medication Sig Dispense Auth. Provider   ondansetron (ZOFRAN) 4 MG tablet Take 1 tablet (4 mg total) by mouth every 6 (six) hours. 12 tablet Eustace MooreNelson, Yvonne Sue, MD   cephALEXin (KEFLEX) 500 MG capsule Take 1 capsule (500 mg total) by mouth 2 (two) times daily. 10 capsule Eustace MooreNelson, Yvonne Sue, MD     Controlled Substance Prescriptions [place] Controlled Substance Registry consulted? Not Applicable   Eustace MooreNelson, Yvonne Sue, MD 11/16/17 1946

## 2017-11-16 NOTE — Discharge Instructions (Addendum)
You have a bladder infection Drink plenty of fluids Take Zofran as needed for any nausea and vomiting Take the Keflex twice a day as antibiotic Return if you fail to improve over the next several days

## 2017-12-17 ENCOUNTER — Ambulatory Visit (HOSPITAL_COMMUNITY)
Admission: EM | Admit: 2017-12-17 | Discharge: 2017-12-17 | Disposition: A | Discharge status: Home / Self Care | Payer: Self-pay | Attending: Family Medicine | Admitting: Family Medicine

## 2017-12-17 ENCOUNTER — Encounter (HOSPITAL_COMMUNITY): Payer: Self-pay

## 2017-12-17 DIAGNOSIS — Z3202 Encounter for pregnancy test, result negative: Secondary | ICD-10-CM

## 2017-12-17 DIAGNOSIS — Z87440 Personal history of urinary (tract) infections: Secondary | ICD-10-CM | POA: Insufficient documentation

## 2017-12-17 DIAGNOSIS — F17290 Nicotine dependence, other tobacco product, uncomplicated: Secondary | ICD-10-CM | POA: Insufficient documentation

## 2017-12-17 DIAGNOSIS — N939 Abnormal uterine and vaginal bleeding, unspecified: Secondary | ICD-10-CM | POA: Insufficient documentation

## 2017-12-17 DIAGNOSIS — R1030 Lower abdominal pain, unspecified: Secondary | ICD-10-CM | POA: Insufficient documentation

## 2017-12-17 DIAGNOSIS — R42 Dizziness and giddiness: Secondary | ICD-10-CM

## 2017-12-17 LAB — POCT URINALYSIS DIP (DEVICE)
Bilirubin Urine: NEGATIVE
Glucose, UA: NEGATIVE mg/dL
Ketones, ur: NEGATIVE mg/dL
Leukocytes, UA: NEGATIVE
Nitrite: NEGATIVE
PH: 6 (ref 5.0–8.0)
PROTEIN: NEGATIVE mg/dL
UROBILINOGEN UA: 1 mg/dL (ref 0.0–1.0)

## 2017-12-17 LAB — POCT PREGNANCY, URINE: Preg Test, Ur: NEGATIVE

## 2017-12-17 MED ORDER — MEDROXYPROGESTERONE ACETATE 10 MG PO TABS: 10.0000 mg | ORAL_TABLET | Freq: Every day | ORAL | 0 refills | Status: AC

## 2017-12-17 NOTE — ED Triage Notes (Signed)
Pt presents to be re evaluated for UTI symptoms, was seen last month but wasn't able to afford medications.

## 2017-12-17 NOTE — Discharge Instructions (Signed)
Urine was negative for urinary tract infection, pregnancy.  Please start Provera as directed for abnormal uterine bleeding.  Follow-up with GYN for further evaluation and management needed.  If experiencing worsening symptoms, worsening abdominal pain, nausea, vomiting, unable to jump up and down due to pain, weakness, worsening dizziness, go to the emergency department for further evaluation.

## 2017-12-17 NOTE — ED Provider Notes (Signed)
[facility]    CSN: [id number] Arrival date & time: 12/17/17  1446     History   Chief Complaint Chief Complaint  Patient presents with  . Urinary Tract Infection    HPI Rachel Lamb is a 22 y.o. female.   22 year old female comes in for continued symptoms after being seen 1 month ago.  At that time, had low abdominal pain, and was tested positive for UTI.  Keflex and Zofran was called into the pharmacy, but patient did not take as she could not afford the medication.  Since then, low abdominal pain has persisted without obvious worsening of symptoms.  States if she stomps her feet hard, can cause worsening abdominal pain.  Nausea has resolved without any vomiting.  Denies fever, chills, night sweats.  Has felt slightly dizzy intermittently without lightheadedness, syncope.  She denies urinary frequency, dysuria.  Has had hematuria for the past month, but unsure if it is vaginal bleeding or hematuria.  She denies vaginal discharge, itching.  Last menstrual was 5 months ago.  Last sexual activity more than 1 month ago, has been with the same female partner for the past 3 years.     History reviewed. No pertinent past medical history.  There are no active problems to display for this patient.   Past Surgical History:  Procedure Laterality Date  . LEG SURGERY      OB History   None      Home Medications    Prior to Admission medications   Medication Sig Start Date End Date Taking? Authorizing Provider  medroxyPROGESTERone (PROVERA) 10 MG tablet Take 1 tablet (10 mg total) by mouth daily. 12/17/17   Belinda FisherYu, Parke Jandreau V, PA-C    Family History Family History  Problem Relation Age of Onset  . Healthy Mother   . Deep vein thrombosis Father     Social History Social History   Tobacco Use  . Smoking status: Current Some Day Smoker    Types: Cigars  . Smokeless tobacco: Never Used  Substance Use Topics  . Alcohol use: No  . Drug use: Never     Allergies     Iodine   Review of Systems Review of Systems  Reason unable to perform ROS: See HPI as above.     Physical Exam Triage Vital Signs ED Triage Vitals [12/17/17 1527]  Enc Vitals Group     BP 116/63     Pulse Rate 65     Resp 20     Temp 98.5 F (36.9 C)     Temp Source Temporal     SpO2 100 %     Weight      Height      Head Circumference      Peak Flow      Pain Score      Pain Loc      Pain Edu?      Excl. in GC?    No data found.  Updated Vital Signs BP 116/63 (BP Location: Right Arm)   Pulse 65   Temp 98.5 F (36.9 C) (Temporal)   Resp 20   LMP  (LMP Unknown)   SpO2 100%   Physical Exam  Constitutional: She is oriented to person, place, and time. She appears well-developed and well-nourished. No distress.  HENT:  Head: Normocephalic and atraumatic.  Eyes: Pupils are equal, round, and reactive to light. Conjunctivae are normal.  Cardiovascular: Normal rate, regular rhythm and normal heart sounds. Exam reveals  no gallop and no friction rub.  No murmur heard. Pulmonary/Chest: Effort normal and breath sounds normal. She has no wheezes. She has no rales.  Abdominal: Soft. Bowel sounds are normal. There is no rigidity and no CVA tenderness.  Mild suprapubic tenderness without guarding or rebound.  Genitourinary: Uterus normal. There is no rash, tenderness or lesion on the right labia. There is no rash, tenderness or lesion on the left labia. Cervix exhibits no motion tenderness, no discharge and no friability. Right adnexum displays no mass and no tenderness. Left adnexum displays no mass and no tenderness. There is bleeding in the vagina. No tenderness in the vagina. No vaginal discharge found.  Neurological: She is alert and oriented to person, place, and time.  Skin: Skin is warm and dry.  Psychiatric: She has a normal mood and affect. Her behavior is normal. Judgment normal.     UC Treatments / Results  Labs (all labs ordered are listed, but only abnormal  results are displayed) Labs Reviewed  POCT URINALYSIS DIP (DEVICE) - Abnormal; Notable for the following components:      Result Value   Hgb urine dipstick LARGE (*)    All other components within normal limits  POCT PREGNANCY, URINE  CERVICOVAGINAL ANCILLARY ONLY    EKG None  Radiology No results found.  Procedures Procedures (including critical care time)  Medications Ordered in UC Medications - No data to display  Initial Impression / Assessment and Plan / UC Course  I have reviewed the triage vital signs and the nursing notes.  Pertinent labs & imaging results that were available during my care of the patient were reviewed by me and considered in my medical decision making (see chart for details).    Urine negative for UTI, pregnancy.  Will start patient on Provera for abnormal uterine bleeding.  Patient to follow-up with GYN for further evaluation and management needed.  Return precautions given.  Patient expresses understanding and agrees to plan.  Final Clinical Impressions(s) / UC Diagnoses   Final diagnoses:  Abnormal uterine bleeding    ED Prescriptions    Medication Sig Dispense Auth. Provider   medroxyPROGESTERone (PROVERA) 10 MG tablet Take 1 tablet (10 mg total) by mouth daily. 10 tablet Threasa Alpha, [place] 12/17/17 1644

## 2017-12-18 LAB — CERVICOVAGINAL ANCILLARY ONLY
BACTERIAL VAGINITIS: NEGATIVE
CHLAMYDIA, DNA PROBE: NEGATIVE
Candida vaginitis: NEGATIVE
NEISSERIA GONORRHEA: NEGATIVE
Trichomonas: POSITIVE — AB

## 2017-12-19 ENCOUNTER — Telehealth (HOSPITAL_COMMUNITY): Payer: Self-pay

## 2017-12-19 MED ORDER — METRONIDAZOLE 500 MG PO TABS: 2000.0000 mg | ORAL_TABLET | Freq: Once | ORAL | 0 refills | Status: AC

## 2017-12-19 NOTE — Telephone Encounter (Signed)
Trichomonas is positive. Rx Flagyl 2000 mg po once was sent to the pharmacy of record. Pt called and made aware.  Educated patient to refrain from sexual intercourse for 7 days to give the medicine time to work. Sexual partners need to be notified and tested/treated. Condoms may reduce risk of reinfection. Recheck for further evaluation if symptoms are not improving. Pt verbalized understanding.

## 2017-12-30 ENCOUNTER — Telehealth: Payer: Self-pay | Admitting: Family Medicine

## 2017-12-30 NOTE — Telephone Encounter (Signed)
Called patient and left a VM to give [place] a call if she would like to follow up from her 09/18 visit at the Urgent Care.

## 2018-01-28 ENCOUNTER — Encounter: Payer: Medicaid Other | Admitting: Obstetrics & Gynecology

## 2018-02-05 ENCOUNTER — Encounter: Payer: Medicaid Other | Admitting: Obstetrics & Gynecology

## 2018-03-19 ENCOUNTER — Other Ambulatory Visit: Payer: Self-pay

## 2018-03-19 ENCOUNTER — Emergency Department
Admission: EM | Admit: 2018-03-19 | Discharge: 2018-03-19 | Disposition: A | Discharge status: Home / Self Care | Payer: Medicaid Other | Attending: Emergency Medicine | Admitting: Emergency Medicine

## 2018-03-19 ENCOUNTER — Encounter: Payer: Self-pay | Admitting: Emergency Medicine

## 2018-03-19 DIAGNOSIS — F17290 Nicotine dependence, other tobacco product, uncomplicated: Secondary | ICD-10-CM | POA: Insufficient documentation

## 2018-03-19 DIAGNOSIS — H1032 Unspecified acute conjunctivitis, left eye: Secondary | ICD-10-CM | POA: Insufficient documentation

## 2018-03-19 DIAGNOSIS — Z79899 Other long term (current) drug therapy: Secondary | ICD-10-CM | POA: Insufficient documentation

## 2018-03-19 MED ORDER — CIPROFLOXACIN HCL 0.3 % OP SOLN
2.0000 [drp] | OPHTHALMIC | Status: DC
  Administered 2018-03-19: 2 [drp] via OPHTHALMIC
  Filled 2018-03-19 (×2): qty 2.5

## 2018-03-19 NOTE — ED Provider Notes (Signed)
[facility] Emergency Department Provider Note   First MD Initiated Contact with Patient 03/19/18 0327     (approximate)  I have reviewed the triage vital signs and the nursing notes.   HISTORY  Chief Complaint Eye Problem   HPI Rachel Lamb is a 22 y.o. female presents emergency department 3-day history of left eye pain with drainage and redness.  Patient denies any fever no cough.  Patient does wear contact lenses however she states that she has remove her left contact as a result of the symptoms.  No known sick contact with conjunctivitis.   Past medical history None There are no active problems to display for this patient.   Past Surgical History:  Procedure Laterality Date  . LEG SURGERY      Prior to Admission medications   Medication Sig Start Date End Date Taking? Authorizing Provider  medroxyPROGESTERone (PROVERA) 10 MG tablet Take 1 tablet (10 mg total) by mouth daily. 12/17/17   Belinda FisherYu, Amy V, PA-C    Allergies Iodine  Family History  Problem Relation Age of Onset  . Healthy Mother   . Deep vein thrombosis Father     Social History Social History   Tobacco Use  . Smoking status: Current Some Day Smoker    Types: Cigars  . Smokeless tobacco: Never Used  Substance Use Topics  . Alcohol use: No  . Drug use: Never    Review of Systems Constitutional: No fever/chills Eyes: No visual changes.  Positive for left eye redness drainage ENT: No sore throat. Cardiovascular: Denies chest pain. Respiratory: Denies shortness of breath. Gastrointestinal: No abdominal pain.  No nausea, no vomiting.  No diarrhea.  No constipation. Genitourinary: Negative for dysuria. Musculoskeletal: Negative for neck pain.  Negative for back pain. Integumentary: Negative for rash. Neurological: Negative for headaches, focal weakness or numbness.   ____________________________________________   PHYSICAL EXAM:  VITAL SIGNS: ED Triage Vitals  [03/19/18 0201]  Enc Vitals Group     BP 126/74     Pulse Rate (!) 104     Resp 18     Temp 98.2 F (36.8 C)     Temp Source Oral     SpO2 99 %     Weight 52.2 kg (115 lb)     Height 1.499 m (4\' 11" )     Head Circumference      Peak Flow      Pain Score 3     Pain Loc      Pain Edu?      Excl. in GC?     Constitutional: Alert and oriented. Well appearing and in no acute distress. Eyes: Conjunctivae are normal.  Head: Atraumatic. Mouth/Throat: Mucous membranes are moist. Oropharynx non-erythematous. Neck: No stridor.   Neurologic:  Normal speech and language. No gross focal neurologic deficits are appreciated.  Skin:  Skin is warm, dry and intact. No rash noted. Psychiatric: Mood and affect are normal. Speech and behavior are normal.     Procedure(s) performed:   Procedures   ____________________________________________   INITIAL IMPRESSION / ASSESSMENT AND PLAN / ED COURSE  As part of my medical decision making, I reviewed the following data within the electronic MEDICAL RECORD NUMBER  22 year old female presented with above-stated history and physical exam secondary to left eye redness pain and drainage.  Clinical exam consistent with acute bacterial conjunctivitis of the left eye.  Patient given Cipro ophthalmic.  ____________________________________________  FINAL CLINICAL IMPRESSION(S) / ED DIAGNOSES  Final  diagnoses:  Acute bacterial conjunctivitis of left eye     MEDICATIONS GIVEN DURING THIS VISIT:  Medications  ciprofloxacin (CILOXAN) 0.3 % ophthalmic solution 2 drop (has no administration in time range)     ED Discharge Orders    None       Note:  This document was prepared using Dragon voice recognition software and may include unintentional dictation errors.    Darci CurrentBrown, [place] N, MD 03/19/18 [phone]

## 2018-03-19 NOTE — ED Triage Notes (Addendum)
Patient ambulatory to triage with steady gait, without difficulty or distress noted; pt reports having left eye pain x 3 days with redness and drainage; pt wears contacts; visual acuity rt 20/50 (corrected with contact), left 20/200 (no correction)

## 2018-06-22 IMAGING — CT CT ABD-PELV W/O CM
2 of 4 series · 16 of 46 positions shown, 18 images · non-contrast
Comparison: None.

CLINICAL DATA: Sudden onset right upper quadrant abdominal pain for
2 days.

EXAM:
CT ABDOMEN AND PELVIS WITHOUT CONTRAST
TECHNIQUE: Multidetector CT imaging of the abdomen and pelvis was performed
following the standard protocol without IV contrast.

[Series 2: abd/pel w/o · axial · non-contrast · 0.67mm/px · z∈[-415,-35]mm · 13 of 86 slices shown, 15 images]
[im 5/86  soft-tissue]
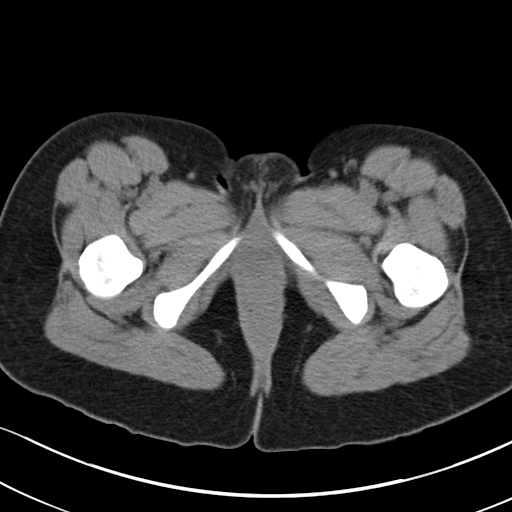
[im 5/86  bone]
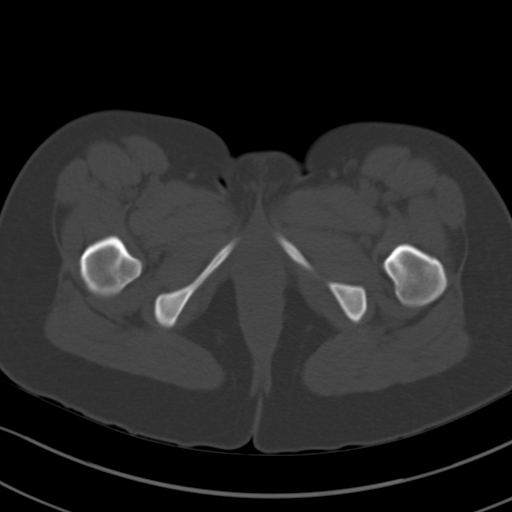
[im 10/86  soft-tissue]
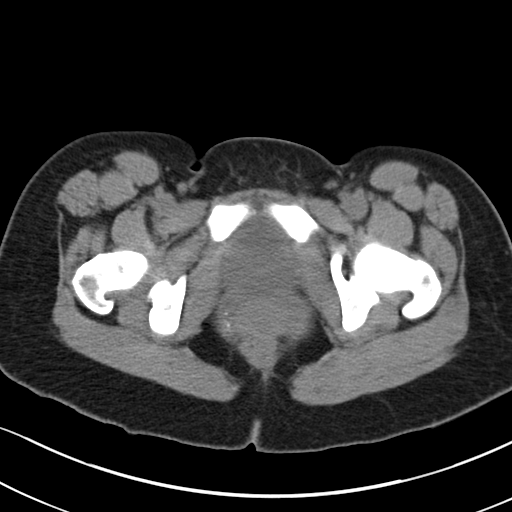
[im 19/86  soft-tissue]
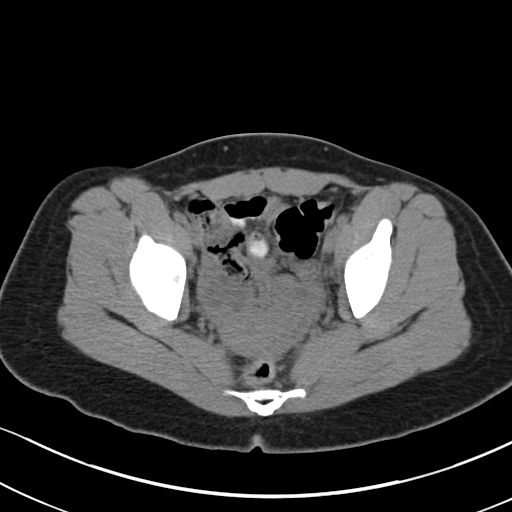
[im 24/86  soft-tissue]
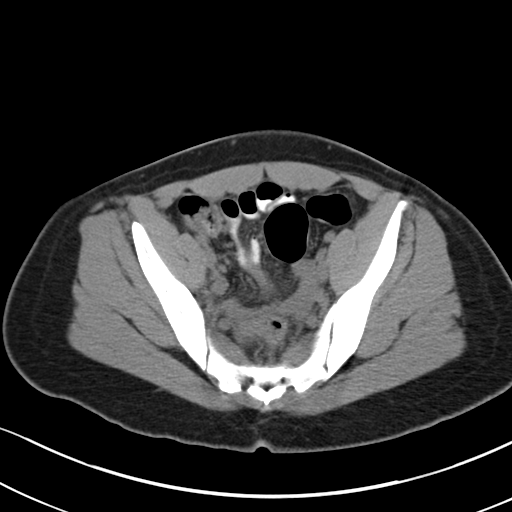
[im 29/86  soft-tissue]
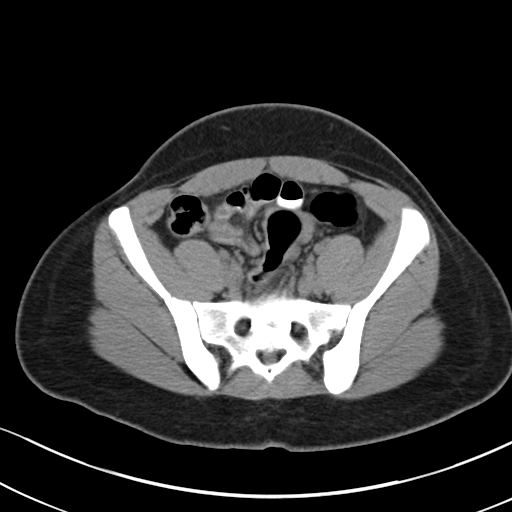
[im 38/86  soft-tissue]
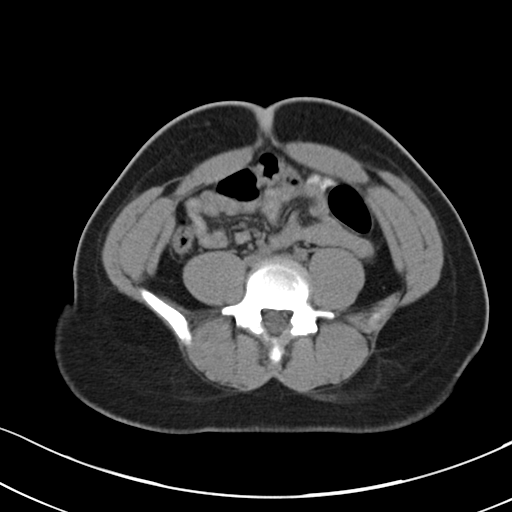
[im 43/86  soft-tissue]
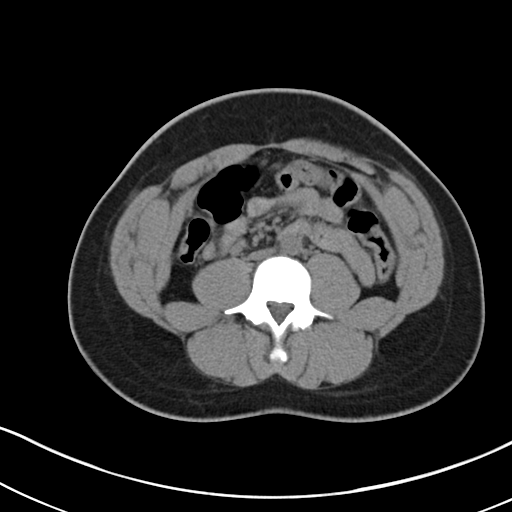
[im 48/86  soft-tissue]
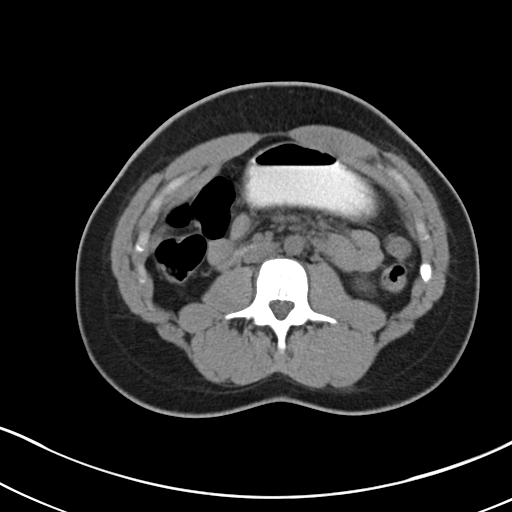
[im 57/86  soft-tissue]
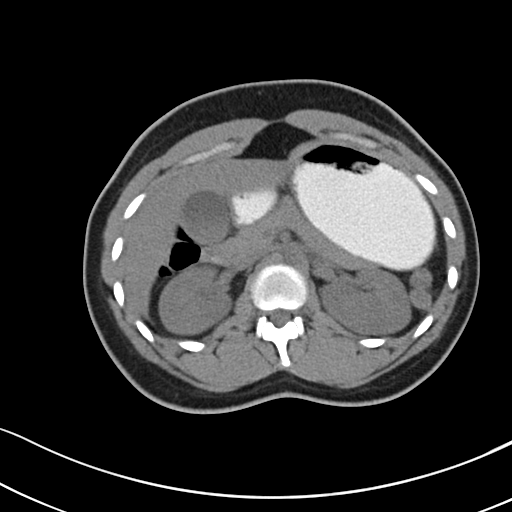
[im 57/86  bone]
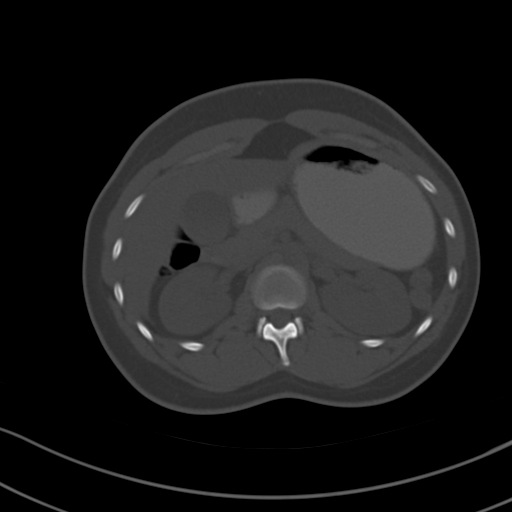
[im 62/86  soft-tissue]
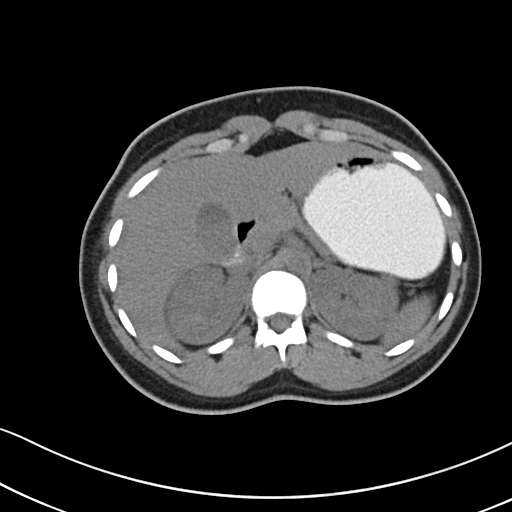
[im 67/86  soft-tissue]
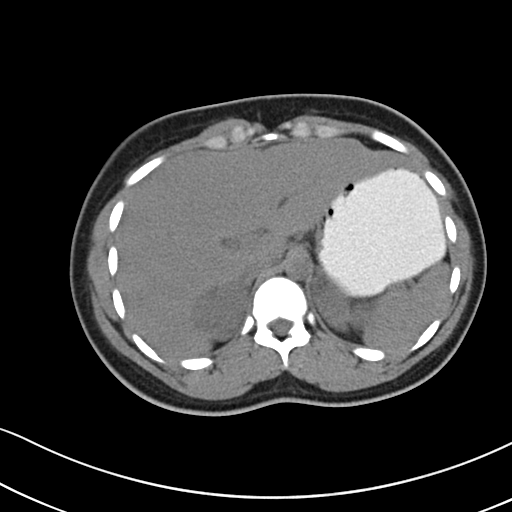
[im 76/86  soft-tissue]
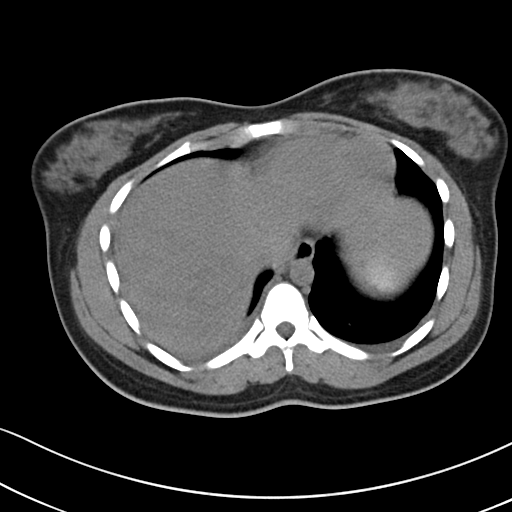
[im 81/86  soft-tissue]
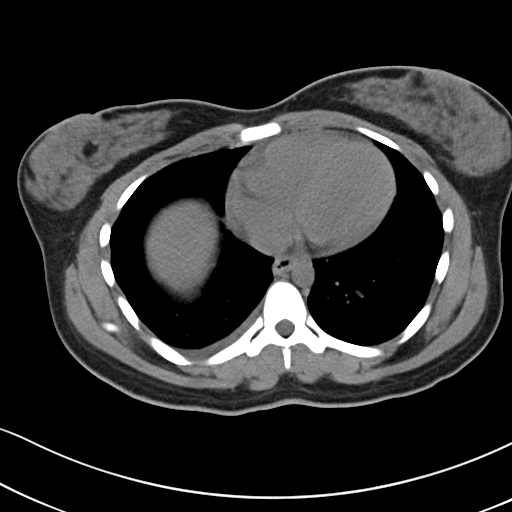

[Series 5: coronal · coronal · 0.61mm/px · 3 of 120 slices shown]
[im 40/120  soft-tissue]
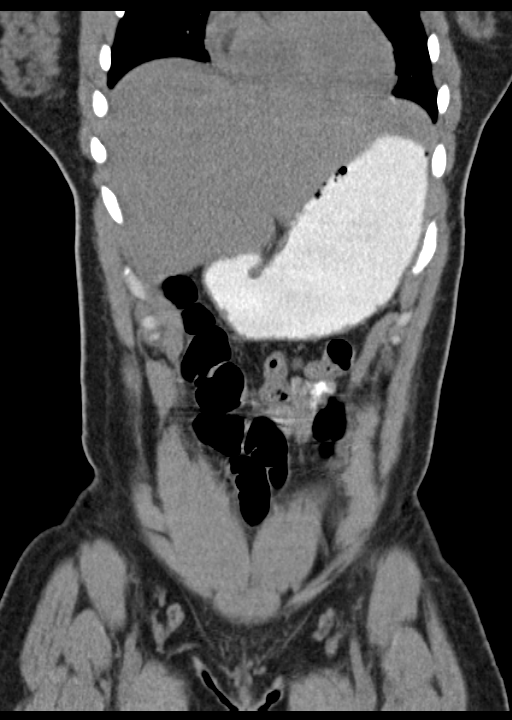
[im 53/120  soft-tissue]
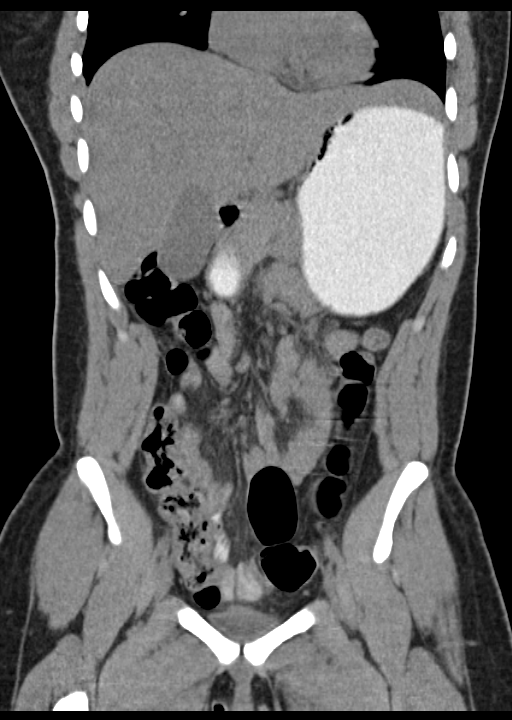
[im 67/120  soft-tissue]
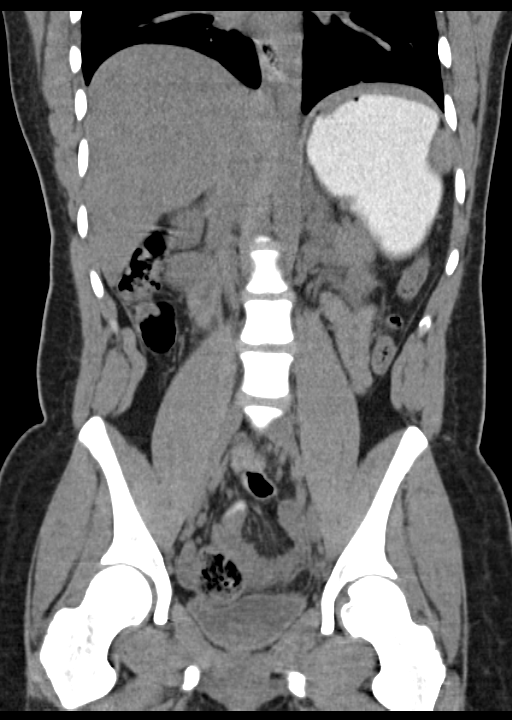

[16 of 46 positions shown; findings below may reference images not displayed]

FINDINGS: Lower chest: Trace bilateral pleural effusions. Lung bases are
clear.

Hepatobiliary: Unenhanced appearance is unremarkable.

Pancreas: Unenhanced appearance is unremarkable.

Spleen: Unenhanced appearance is unremarkable.

Adrenals/Urinary Tract: Adrenal glands are unremarkable. Kidneys are
normal, without renal calculi, focal lesion, or hydronephrosis.
Bladder is unremarkable.

Stomach/Bowel: Stomach, small bowel, and colon are not abnormally
distended. Most of the contrast material remains in the stomach,
possibly indicating decreased motility. No inflammatory changes
suggested. Appendix is normal.

Vascular/Lymphatic: No significant vascular findings are present. No
enlarged abdominal or pelvic lymph nodes.

Reproductive: Uterus and bilateral adnexa are unremarkable.

Other: No abdominal wall hernia or abnormality. No abdominopelvic
ascites.

Musculoskeletal: No acute or significant osseous findings.
IMPRESSION: No acute process demonstrated in the abdomen or pelvis. No evidence
of bowel obstruction or inflammation. Most of the oral contrast
material remains in the stomach, possibly indicating hypomotility.
Trace bilateral pleural effusions.

## 2018-11-01 ENCOUNTER — Ambulatory Visit (HOSPITAL_COMMUNITY)
Admission: EM | Admit: 2018-11-01 | Discharge: 2018-11-01 | Disposition: A | Discharge status: Home / Self Care | Payer: Medicaid Other | Attending: Family Medicine | Admitting: Family Medicine

## 2018-11-01 ENCOUNTER — Encounter (HOSPITAL_COMMUNITY): Payer: Self-pay

## 2018-11-01 ENCOUNTER — Other Ambulatory Visit: Payer: Self-pay

## 2018-11-01 DIAGNOSIS — F418 Other specified anxiety disorders: Secondary | ICD-10-CM

## 2018-11-01 DIAGNOSIS — Z202 Contact with and (suspected) exposure to infections with a predominantly sexual mode of transmission: Secondary | ICD-10-CM | POA: Insufficient documentation

## 2018-11-01 NOTE — ED Triage Notes (Signed)
Pt presents for STD screening after possible exposure from partner.  Pt does not have any complaints of any symptoms.

## 2018-11-01 NOTE — Discharge Instructions (Signed)
We will call you with your results if positive & treat if indicated. Return if symptoms worsen/do not resolve, you develop fever, abdominal pain, blood in your urine, or are re-exposed to an STI.

## 2018-11-01 NOTE — ED Provider Notes (Signed)
[facility]    CSN: [id number] Arrival date & time: 11/01/18  1545     History   Chief Complaint Chief Complaint  Patient presents with  . Exposure to STD    HPI Rachel Lamb is a 23 y.o. female presenting for possible STD exposure.  States that her and her partner were an argument when he stated "that he may have cheated on your son like that ".  Denies confirmed STI.  Patient is asymptomatic at this time, though very worried that she might have something.    History reviewed. No pertinent past medical history.  There are no active problems to display for this patient.   Past Surgical History:  Procedure Laterality Date  . LEG SURGERY      OB History   No obstetric history on file.      Home Medications    Prior to Admission medications   Medication Sig Start Date End Date Taking? Authorizing Provider  medroxyPROGESTERone (PROVERA) 10 MG tablet Take 1 tablet (10 mg total) by mouth daily. 12/17/17   Belinda FisherYu, Amy V, PA-C    Family History Family History  Problem Relation Age of Onset  . Healthy Mother   . Deep vein thrombosis Father     Social History Social History   Tobacco Use  . Smoking status: Current Some Day Smoker    Types: Cigars  . Smokeless tobacco: Never Used  Substance Use Topics  . Alcohol use: No  . Drug use: Never     Allergies   Iodine   Review of Systems Review of Systems  Constitutional: Negative for fatigue and fever.  HENT: Negative for ear pain, sinus pain, sore throat and voice change.   Eyes: Negative for pain, redness and visual disturbance.  Respiratory: Negative for cough and shortness of breath.   Cardiovascular: Negative for chest pain and palpitations.  Gastrointestinal: Negative for abdominal pain, diarrhea and vomiting.  Musculoskeletal: Negative for arthralgias and myalgias.  Skin: Negative for rash and wound.  Neurological: Negative for syncope and headaches.     Physical Exam Triage Vital Signs  ED Triage Vitals  Enc Vitals Group     BP 11/01/18 1639 118/78     Pulse Rate 11/01/18 1639 60     Resp 11/01/18 1639 18     Temp 11/01/18 1639 98.2 F (36.8 C)     Temp Source 11/01/18 1639 Oral     SpO2 11/01/18 1639 95 %     Weight --      Height --      Head Circumference --      Peak Flow --      Pain Score 11/01/18 1640 0     Pain Loc --      Pain Edu? --      Excl. in GC? --    No data found.  Updated Vital Signs BP 118/78 (BP Location: Left Arm)   Pulse 60   Temp 98.2 F (36.8 C) (Oral)   Resp 18   LMP 10/13/2018   SpO2 95%    Physical Exam Constitutional:      General: She is not in acute distress. HENT:     Head: Normocephalic and atraumatic.  Eyes:     General: No scleral icterus.    Pupils: Pupils are equal, round, and reactive to light.  Cardiovascular:     Rate and Rhythm: Normal rate.  Pulmonary:     Effort: Pulmonary effort is normal.  Abdominal:  General: Bowel sounds are normal.     Palpations: Abdomen is soft.     Tenderness: There is no abdominal tenderness. There is no right CVA tenderness, left CVA tenderness or guarding.  Genitourinary:    Comments: Patient declined, self-swab performed Skin:    Capillary Refill: Patient wearing acrylic nails    Coloration: Skin is not jaundiced or pale.  Neurological:     Mental Status: She is alert and oriented to person, place, and time.      UC Treatments / Results  Labs (all labs ordered are listed, but only abnormal results are displayed) Labs Reviewed  CERVICOVAGINAL ANCILLARY ONLY    EKG   Radiology No results found.  Procedures Procedures (including critical care time)  Medications Ordered in UC Medications - No data to display  Initial Impression / Assessment and Plan / UC Course  I have reviewed the triage vital signs and the nursing notes.  Pertinent labs & imaging results that were available during my care of the patient were reviewed by me and considered in my  medical decision making (see chart for details).     1.  Possible exposure to STD STI panel pending, will treat if indicated.  Return precautions discussed, patient verbalized understanding and is agreeable to plan.  2.  Anxiety about health Denies SI/HI.  Feels that she will feel better after she knows her test results.  Final Clinical Impressions(s) / UC Diagnoses   Final diagnoses:  Possible exposure to STD  Anxiety about health     Discharge Instructions     We will call you with your results if positive & treat if indicated. Return if symptoms worsen/do not resolve, you develop fever, abdominal pain, blood in your urine, or are re-exposed to an STI.    ED Prescriptions    None     Controlled Substance Prescriptions [place] Controlled Substance Registry consulted? Not Applicable   Quincy Sheehan, [place] 11/01/18 1845

## 2018-11-04 LAB — CERVICOVAGINAL ANCILLARY ONLY
Candida vaginitis: NEGATIVE
Chlamydia: POSITIVE — AB
Neisseria Gonorrhea: POSITIVE — AB
Trichomonas: POSITIVE — AB

## 2018-11-06 ENCOUNTER — Telehealth (HOSPITAL_COMMUNITY): Payer: Self-pay | Admitting: Emergency Medicine

## 2018-11-06 MED ORDER — METRONIDAZOLE 500 MG PO TABS: 500.0000 mg | ORAL_TABLET | Freq: Two times a day (BID) | ORAL | 0 refills | Status: AC

## 2018-11-06 NOTE — Telephone Encounter (Signed)
Chlamydia is positive.  Rx po zithromax 1g #1 dose no refills was sent to the pharmacy of record.  Pt needs education to please refrain from sexual intercourse for 7 days to give the medicine time to work, sexual partners need to be notified and tested/treated.  Condoms may reduce risk of reinfection.  Recheck or followup with PCP for further evaluation if symptoms are not improving.   [facility] notified.  Gonorrhea is positive.  Patient should return as soon as possible to the urgent care for treatment with IM rocephin 250mg  and po zithromax 1g. Patient will not need to see a provider unless there are new symptoms she would like evaluated. Pt needs education to refrain from sexual intercourse for now and for 7 days after treatment to give the medicine time to work. Sexual partners need to be notified and tested/treated. Condoms may reduce risk of reinfection. [facility] notified.   Bacterial vaginosis is positive. This was not treated at the urgent care visit.  Flagyl 500 mg BID x 7 days #14 no refills sent to patients pharmacy of choice.    Trich, not treated, will be treated with flagyl  Patient contacted and made aware of    results, all questions answered  Will return tomorrow for treatment.

## 2018-11-09 ENCOUNTER — Encounter (HOSPITAL_COMMUNITY): Payer: Self-pay

## 2018-11-09 ENCOUNTER — Telehealth (HOSPITAL_COMMUNITY): Payer: Self-pay | Admitting: Emergency Medicine

## 2018-11-09 NOTE — Telephone Encounter (Signed)
Pt did not return for treatment. Attempted to reach patient. No answer at this time. Voicemail left. Mychart Message sent.

## 2018-11-10 ENCOUNTER — Telehealth (HOSPITAL_COMMUNITY): Payer: Self-pay | Admitting: Emergency Medicine

## 2018-11-10 NOTE — Telephone Encounter (Signed)
Attempted to reach patient x3. No answer at this time. Voicemail left. Pt is aware of results from 8/7.

## 2019-07-02 ENCOUNTER — Ambulatory Visit: Payer: Medicaid Other | Attending: Internal Medicine

## 2019-07-02 DIAGNOSIS — Z20822 Contact with and (suspected) exposure to covid-19: Secondary | ICD-10-CM

## 2019-07-03 LAB — SARS-COV-2, NAA 2 DAY TAT

## 2019-07-03 LAB — NOVEL CORONAVIRUS, NAA: SARS-CoV-2, NAA: NOT DETECTED

## 2019-11-20 ENCOUNTER — Other Ambulatory Visit: Payer: Self-pay

## 2019-11-20 ENCOUNTER — Ambulatory Visit (HOSPITAL_COMMUNITY)
Admission: EM | Admit: 2019-11-20 | Discharge: 2019-11-20 | Disposition: A | Discharge status: Home / Self Care | Payer: Medicaid Other | Attending: Emergency Medicine | Admitting: Emergency Medicine

## 2019-11-20 ENCOUNTER — Encounter (HOSPITAL_COMMUNITY): Payer: Self-pay | Admitting: *Deleted

## 2019-11-20 DIAGNOSIS — Z202 Contact with and (suspected) exposure to infections with a predominantly sexual mode of transmission: Secondary | ICD-10-CM | POA: Insufficient documentation

## 2019-11-20 DIAGNOSIS — Z3202 Encounter for pregnancy test, result negative: Secondary | ICD-10-CM

## 2019-11-20 HISTORY — DX: Chlamydial infection, unspecified: A74.9

## 2019-11-20 HISTORY — DX: Gonococcal infection, unspecified: A54.9

## 2019-11-20 LAB — POC URINE PREG, ED: Preg Test, Ur: NEGATIVE

## 2019-11-20 LAB — HIV ANTIBODY (ROUTINE TESTING W REFLEX): HIV Screen 4th Generation wRfx: NONREACTIVE

## 2019-11-20 MED ORDER — AZITHROMYCIN 250 MG PO TABS
1000.0000 mg | ORAL_TABLET | Freq: Once | ORAL | Status: AC
  Administered 2019-11-20: 1000 mg via ORAL

## 2019-11-20 MED ORDER — AZITHROMYCIN 250 MG PO TABS
ORAL_TABLET | ORAL | Status: AC
  Filled 2019-11-20: qty 4

## 2019-11-20 NOTE — ED Triage Notes (Signed)
Denies any sxs.  Reports being told by sexual partner that he has chlamydia.

## 2019-11-20 NOTE — Discharge Instructions (Signed)
We have treated you today for Chlamydia, with azithromycin. Please refrain from sexual intercourse for 7 days while medicines eliminating infection.   We are testing you for Gonorrhea, Chlamydia, Trichomonas, HIV and Syphillis. We will call you if anything is positive and let you know if you require any further treatment. Please inform partners of any positive results.   Please return if symptoms not improving with treatment, development of fever, nausea, vomiting, abdominal pain.

## 2019-11-21 LAB — RPR: RPR Ser Ql: NONREACTIVE

## 2019-11-21 NOTE — ED Provider Notes (Signed)
[facility]    CSN: [id number] Arrival date & time: 11/20/19  1520      History   Chief Complaint Chief Complaint  Patient presents with  . Exposure to STD    HPI Rachel Lamb is a 24 y.o. female presenting today for exposure to STD.  Patient reports that her partner recently tested positive for chlamydia.  She denies any symptoms.  Denies any discharge, pelvic pain, abnormal bleeding.  Last menstrual cycle was 10/14/2019.  Reports typically irregular cycles.  HPI  Past Medical History:  Diagnosis Date  . Chlamydia   . Gonorrhea     There are no problems to display for this patient.   Past Surgical History:  Procedure Laterality Date  . LEG SURGERY      OB History   No obstetric history on file.      Home Medications    Prior to Admission medications   Medication Sig Start Date End Date Taking? Authorizing Provider  medroxyPROGESTERone (PROVERA) 10 MG tablet Take 1 tablet (10 mg total) by mouth daily. 12/17/17   Belinda Fisher, PA-C    Family History Family History  Problem Relation Age of Onset  . Healthy Mother   . Deep vein thrombosis Father     Social History Social History   Tobacco Use  . Smoking status: Current Some Day Smoker    Types: Cigars  . Smokeless tobacco: Never Used  Vaping Use  . Vaping Use: Never used  Substance Use Topics  . Alcohol use: No  . Drug use: Yes    Types: Marijuana     Allergies   Iodine   Review of Systems Review of Systems  Constitutional: Negative for fever.  Respiratory: Negative for shortness of breath.   Cardiovascular: Negative for chest pain.  Gastrointestinal: Negative for abdominal pain, diarrhea, nausea and vomiting.  Genitourinary: Negative for dysuria, flank pain, genital sores, hematuria, menstrual problem, vaginal bleeding, vaginal discharge and vaginal pain.  Musculoskeletal: Negative for back pain.  Skin: Negative for rash.  Neurological: Negative for dizziness, light-headedness  and headaches.     Physical Exam Triage Vital Signs ED Triage Vitals  Enc Vitals Group     BP 11/20/19 1654 128/87     Pulse Rate 11/20/19 1654 84     Resp 11/20/19 1654 16     Temp 11/20/19 1654 98.3 F (36.8 C)     Temp Source 11/20/19 1654 Oral     SpO2 11/20/19 1654 100 %     Weight --      Height --      Head Circumference --      Peak Flow --      Pain Score 11/20/19 1712 0     Pain Loc --      Pain Edu? --      Excl. in GC? --    No data found.  Updated Vital Signs BP 128/87 (BP Location: Left Arm)   Pulse 84   Temp 98.3 F (36.8 C) (Oral)   Resp 16   LMP 10/14/2019 (Exact Date)   SpO2 100%   Visual Acuity Right Eye Distance:   Left Eye Distance:   Bilateral Distance:    Right Eye Near:   Left Eye Near:    Bilateral Near:     Physical Exam Vitals and nursing note reviewed.  Constitutional:      Appearance: She is well-developed.     Comments: No acute distress  HENT:  Head: Normocephalic and atraumatic.     Nose: Nose normal.  Eyes:     Conjunctiva/sclera: Conjunctivae normal.  Cardiovascular:     Rate and Rhythm: Normal rate.  Pulmonary:     Effort: Pulmonary effort is normal. No respiratory distress.  Abdominal:     General: There is no distension.  Musculoskeletal:        General: Normal range of motion.     Cervical back: Neck supple.  Skin:    General: Skin is warm and dry.  Neurological:     Mental Status: She is alert and oriented to person, place, and time.      UC Treatments / Results  Labs (all labs ordered are listed, but only abnormal results are displayed) Labs Reviewed  HIV ANTIBODY (ROUTINE TESTING W REFLEX)  RPR  POC URINE PREG, ED  CERVICOVAGINAL ANCILLARY ONLY    EKG   Radiology No results found.  Procedures Procedures (including critical care time)  Medications Ordered in UC Medications  azithromycin (ZITHROMAX) tablet 1,000 mg (1,000 mg Oral Given 11/20/19 1749)    Initial Impression /  Assessment and Plan / UC Course  I have reviewed the triage vital signs and the nursing notes.  Pertinent labs & imaging results that were available during my care of the patient were reviewed by me and considered in my medical decision making (see chart for details).     1.  Chlamydia exposure/STD screening: Vaginal swab pending for screening for gonorrhea chlamydia and trichomonas, HIV and RPR blood work pending.  Empirically treating for chlamydia given exposure with azithromycin 1 g.  Will call with results and alter therapy as needed.  Discussed strict return precautions. Patient verbalized understanding and is agreeable with plan.  Final Clinical Impressions(s) / UC Diagnoses   Final diagnoses:  Exposure to STD     Discharge Instructions     We have treated you today for Chlamydia, with azithromycin. Please refrain from sexual intercourse for 7 days while medicines eliminating infection.   We are testing you for Gonorrhea, Chlamydia, Trichomonas, HIV and Syphillis. We will call you if anything is positive and let you know if you require any further treatment. Please inform partners of any positive results.   Please return if symptoms not improving with treatment, development of fever, nausea, vomiting, abdominal pain.    ED Prescriptions    None     PDMP not reviewed this encounter.   Lew Dawes, [place] 11/21/19 [phone]

## 2019-11-22 LAB — CERVICOVAGINAL ANCILLARY ONLY
Chlamydia: POSITIVE — AB
Comment: NEGATIVE
Comment: NEGATIVE
Comment: NORMAL
Neisseria Gonorrhea: NEGATIVE
Trichomonas: POSITIVE — AB

## 2019-11-26 ENCOUNTER — Telehealth (HOSPITAL_COMMUNITY): Payer: Self-pay | Admitting: Emergency Medicine

## 2019-11-26 MED ORDER — AZITHROMYCIN 250 MG PO TABS: 1000.0000 mg | ORAL_TABLET | Freq: Once | ORAL | 0 refills | Status: AC

## 2019-11-26 MED ORDER — METRONIDAZOLE 500 MG PO TABS: 500.0000 mg | ORAL_TABLET | Freq: Two times a day (BID) | ORAL | 0 refills | Status: AC

## 2020-02-04 ENCOUNTER — Encounter (HOSPITAL_COMMUNITY): Payer: Self-pay

## 2020-02-04 ENCOUNTER — Ambulatory Visit (HOSPITAL_COMMUNITY)
Admission: EM | Admit: 2020-02-04 | Discharge: 2020-02-04 | Disposition: A | Discharge status: Home / Self Care | Payer: Medicaid Other | Attending: Emergency Medicine | Admitting: Emergency Medicine

## 2020-02-04 ENCOUNTER — Other Ambulatory Visit: Payer: Self-pay

## 2020-02-04 DIAGNOSIS — S76111A Strain of right quadriceps muscle, fascia and tendon, initial encounter: Secondary | ICD-10-CM

## 2020-02-04 MED ORDER — NAPROXEN 500 MG PO TABS: 500.0000 mg | ORAL_TABLET | Freq: Two times a day (BID) | ORAL | 0 refills | Status: AC

## 2020-02-04 NOTE — ED Provider Notes (Signed)
[facility]    CSN: [id number] Arrival date & time: 02/04/20  0938      History   Chief Complaint Chief Complaint  Patient presents with  . Leg Pain    HPI Rachel Lamb is a 24 y.o. female.   Rachel Lamb presents with complaints of right thigh pain. Last night was running, stepped into a hole causing her trunk to fall forward and her leg to stay planted. Some pain after this but was ambulatory. Pain worse this morning. Pain with weight bearing and with knee extension. No previous injury. No numbness or tingling. No bruising or hematoma presence. Hasn't taken any medications for pain.    ROS per HPI, negative if not otherwise mentioned.      Past Medical History:  Diagnosis Date  . Chlamydia   . Gonorrhea     There are no problems to display for this patient.   Past Surgical History:  Procedure Laterality Date  . LEG SURGERY      OB History   No obstetric history on file.      Home Medications    Prior to Admission medications   Medication Sig Start Date End Date Taking? Authorizing Provider  medroxyPROGESTERone (PROVERA) 10 MG tablet Take 1 tablet (10 mg total) by mouth daily. 12/17/17   Cathie Hoops, Amy V, PA-C  metroNIDAZOLE (FLAGYL) 500 MG tablet Take 1 tablet (500 mg total) by mouth 2 (two) times daily. 11/26/19   Lamptey, Britta Mccreedy, MD  naproxen (NAPROSYN) 500 MG tablet Take 1 tablet (500 mg total) by mouth 2 (two) times daily. 02/04/20   Georgetta Haber, NP    Family History Family History  Problem Relation Age of Onset  . Healthy Mother   . Deep vein thrombosis Father     Social History Social History   Tobacco Use  . Smoking status: Current Some Day Smoker    Types: Cigars  . Smokeless tobacco: Never Used  Vaping Use  . Vaping Use: Never used  Substance Use Topics  . Alcohol use: No  . Drug use: Yes    Types: Marijuana     Allergies   Iodine   Review of Systems Review of Systems   Physical Exam Triage Vital Signs ED  Triage Vitals  Enc Vitals Group     BP 02/04/20 1918 (!) 122/59     Pulse Rate 02/04/20 1918 91     Resp 02/04/20 1918 17     Temp 02/04/20 1918 97.9 F (36.6 C)     Temp Source 02/04/20 1918 Oral     SpO2 02/04/20 1918 97 %     Weight --      Height --      Head Circumference --      Peak Flow --      Pain Score 02/04/20 1917 6     Pain Loc --      Pain Edu? --      Excl. in GC? --    No data found.  Updated Vital Signs BP (!) 122/59 (BP Location: Right Arm)   Pulse 91   Temp 97.9 F (36.6 C) (Oral)   Resp 17   LMP  (Within Days)   SpO2 97%   Visual Acuity Right Eye Distance:   Left Eye Distance:   Bilateral Distance:    Right Eye Near:   Left Eye Near:    Bilateral Near:     Physical Exam Constitutional:  General: She is not in acute distress.    Appearance: She is well-developed.  Cardiovascular:     Rate and Rhythm: Normal rate.  Pulmonary:     Effort: Pulmonary effort is normal.  Musculoskeletal:     Right upper leg: Tenderness present. No swelling, edema, deformity, lacerations or bony tenderness.       Legs:     Comments: Right thigh tenderness, generalized; mild pain with knee extension; no hematoma or bruising noted. Patient able to raise leg from chair in hip flexion with mild pain only; mild pain with knee flexion   Skin:    General: Skin is warm and dry.  Neurological:     Mental Status: She is alert and oriented to person, place, and time.      UC Treatments / Results  Labs (all labs ordered are listed, but only abnormal results are displayed) Labs Reviewed - No data to display  EKG   Radiology No results found.  Procedures Procedures (including critical care time)  Medications Ordered in UC Medications - No data to display  Initial Impression / Assessment and Plan / UC Course  I have reviewed the triage vital signs and the nursing notes.  Pertinent labs & imaging results that were available during my care of the patient  were reviewed by me and considered in my medical decision making (see chart for details).     Quadricept strain without evidence of complete tear. Pain management and expected course of rehab discussed. Patient verbalized understanding and agreeable to plan.    Final Clinical Impressions(s) / UC Diagnoses   Final diagnoses:  Quadriceps strain, right, initial encounter     Discharge Instructions     Light activity as tolerated.  Ice application, particularly after use.  Naproxen twice a day, take with food.  Ace wrap as needed for comfort.  Follow up with sports medicine as needed if pain is persistent.    ED Prescriptions    Medication Sig Dispense Auth. Provider   naproxen (NAPROSYN) 500 MG tablet Take 1 tablet (500 mg total) by mouth 2 (two) times daily. 30 tablet Georgetta Haber, NP     PDMP not reviewed this encounter.   Georgetta Haber, NP 02/04/20 2003

## 2020-02-04 NOTE — Discharge Instructions (Signed)
Light activity as tolerated.  Ice application, particularly after use.  Naproxen twice a day, take with food.  Ace wrap as needed for comfort.  Follow up with sports medicine as needed if pain is persistent.

## 2020-02-04 NOTE — ED Triage Notes (Signed)
Pt presents with right thigh pain x 1 day. Reports she was running to her apartment and the right leg got stuck in a hole. Pain is worse when walking. Pt has not take any medication for the pain.

## 2022-02-20 ENCOUNTER — Encounter: Payer: Self-pay | Admitting: Obstetrics & Gynecology

## 2022-03-30 ENCOUNTER — Emergency Department (HOSPITAL_COMMUNITY)
Admission: EM | Admit: 2022-03-30 | Discharge: 2022-03-30 | Disposition: A | Discharge status: Home / Self Care | Payer: Medicaid Other | Attending: Emergency Medicine | Admitting: Emergency Medicine

## 2022-03-30 ENCOUNTER — Emergency Department (HOSPITAL_COMMUNITY): Payer: Medicaid Other

## 2022-03-30 ENCOUNTER — Other Ambulatory Visit (HOSPITAL_COMMUNITY): Payer: Medicaid Other

## 2022-03-30 DIAGNOSIS — N83291 Other ovarian cyst, right side: Secondary | ICD-10-CM | POA: Insufficient documentation

## 2022-03-30 DIAGNOSIS — R1084 Generalized abdominal pain: Secondary | ICD-10-CM | POA: Diagnosis present

## 2022-03-30 DIAGNOSIS — R102 Pelvic and perineal pain: Secondary | ICD-10-CM | POA: Diagnosis not present

## 2022-03-30 DIAGNOSIS — N83201 Unspecified ovarian cyst, right side: Secondary | ICD-10-CM

## 2022-03-30 LAB — URINALYSIS, ROUTINE W REFLEX MICROSCOPIC
Bilirubin Urine: NEGATIVE
Glucose, UA: NEGATIVE mg/dL
Ketones, ur: NEGATIVE mg/dL
Nitrite: NEGATIVE
Protein, ur: 100 mg/dL — AB
Specific Gravity, Urine: 1.029 (ref 1.005–1.030)
Squamous Epithelial / LPF: >50 /HPF — ABNORMAL HIGH (ref 0–5)
WBC, UA: >50 WBC/hpf — ABNORMAL HIGH (ref 0–5)
pH: 5 (ref 5.0–8.0)

## 2022-03-30 LAB — LIPASE, BLOOD: Lipase: 38 U/L (ref 11–51)

## 2022-03-30 LAB — CBC
HCT: 27 % — ABNORMAL LOW (ref 36.0–46.0)
Hemoglobin: 7.7 g/dL — ABNORMAL LOW (ref 12.0–15.0)
MCH: 17.3 pg — ABNORMAL LOW (ref 26.0–34.0)
MCHC: 28.5 g/dL — ABNORMAL LOW (ref 30.0–36.0)
MCV: 60.8 fL — ABNORMAL LOW (ref 80.0–100.0)
Platelets: 348 10*3/uL (ref 150–400)
RBC: 4.44 MIL/uL (ref 3.87–5.11)
RDW: 20.9 % — ABNORMAL HIGH (ref 11.5–15.5)
WBC: 25.7 10*3/uL — ABNORMAL HIGH (ref 4.0–10.5)
nRBC: 0 % (ref 0.0–0.2)

## 2022-03-30 LAB — COMPREHENSIVE METABOLIC PANEL
ALT: 26 U/L (ref 0–44)
AST: 20 U/L (ref 15–41)
Albumin: 3.9 g/dL (ref 3.5–5.0)
Alkaline Phosphatase: 55 U/L (ref 38–126)
Anion gap: 10 (ref 5–15)
BUN: 13 mg/dL (ref 6–20)
CO2: 23 mmol/L (ref 22–32)
Calcium: 9.3 mg/dL (ref 8.9–10.3)
Chloride: 102 mmol/L (ref 98–111)
Creatinine, Ser: 1.02 mg/dL — ABNORMAL HIGH (ref 0.44–1.00)
GFR, Estimated: >60 mL/min (ref >60)
Glucose, Bld: 135 mg/dL — ABNORMAL HIGH (ref 70–99)
Potassium: 3.6 mmol/L (ref 3.5–5.1)
Sodium: 135 mmol/L (ref 135–145)
Total Bilirubin: 0.7 mg/dL (ref 0.3–1.2)
Total Protein: 7.8 g/dL (ref 6.5–8.1)

## 2022-03-30 LAB — I-STAT BETA HCG BLOOD, ED (MC, WL, AP ONLY): I-stat hCG, quantitative: <5 m[IU]/mL (ref <5)

## 2022-03-30 MED ORDER — HYDROCODONE-ACETAMINOPHEN 5-325 MG PO TABS: 1.0000 | ORAL_TABLET | ORAL | 0 refills | Status: AC | PRN

## 2022-03-30 MED ORDER — NAPROXEN 500 MG PO TABS: 500.0000 mg | ORAL_TABLET | Freq: Two times a day (BID) | ORAL | 0 refills | Status: AC

## 2022-03-30 MED ORDER — MORPHINE SULFATE (PF) 4 MG/ML IV SOLN
4.0000 mg | Freq: Once | INTRAVENOUS | Status: AC
  Administered 2022-03-30: 4 mg via INTRAVENOUS
  Filled 2022-03-30: qty 1

## 2022-03-30 MED ORDER — ONDANSETRON HCL 4 MG/2ML IJ SOLN
4.0000 mg | Freq: Once | INTRAMUSCULAR | Status: AC
  Administered 2022-03-30: 4 mg via INTRAVENOUS
  Filled 2022-03-30: qty 2

## 2022-03-30 NOTE — ED Provider Notes (Cosign Needed)
[facility] EMERGENCY DEPARTMENT Provider Note   CSN: [id number] Arrival date & time: 03/30/22  1432     History  Chief Complaint  Patient presents with   Abdominal Pain    ZAARA SPROWL is a 26 y.o. female.  Patient presents the emergency department complaining of generalized abdominal pain.  Patient states that began across the lower abdomen yesterday suddenly at around noon.  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  Today this pain is radiating towards her back.  The patient has a history of a ruptured ovarian cyst and states that this pain feels similar to the previous rupture.  The patient's last menstrual period was a week ago.  She does endorse nausea and vomiting.  She states she tried ibuprofen at home but vomited shortly after taking the medicine.  She denies fevers, chills, dysuria, vaginal bleeding, vaginal discharge.  Patient states over the last ruptured ovarian cyst required no surgical intervention.  Past medical history otherwise significant for STIs  HPI     Home Medications Prior to Admission medications   Medication Sig Start Date End Date Taking? Authorizing Provider  HYDROcodone-acetaminophen (NORCO/VICODIN) 5-325 MG tablet Take 1 tablet by mouth every 4 (four) hours as needed. 03/30/22  Yes Darrick Grinder, PA-C  naproxen (NAPROSYN) 500 MG tablet Take 1 tablet (500 mg total) by mouth 2 (two) times daily. 03/30/22  Yes Darrick Grinder, PA-C  medroxyPROGESTERone (PROVERA) 10 MG tablet Take 1 tablet (10 mg total) by mouth daily. 12/17/17   Cathie Hoops, Amy V, PA-C  metroNIDAZOLE (FLAGYL) 500 MG tablet Take 1 tablet (500 mg total) by mouth 2 (two) times daily. 11/26/19   Lamptey, Britta Mccreedy, MD  naproxen (NAPROSYN) 500 MG tablet Take 1 tablet (500 mg total) by mouth 2 (two) times daily. 02/04/20   Georgetta Haber, NP      Allergies    Iodine    Review of Systems   Review of Systems  Constitutional:  Negative for fever.   Respiratory:  Negative for shortness of breath.   Cardiovascular:  Negative for chest pain.  Gastrointestinal:  Positive for abdominal pain, nausea and vomiting. Negative for constipation and diarrhea.  Genitourinary:  Negative for dysuria, hematuria, vaginal bleeding and vaginal discharge.    Physical Exam Updated Vital Signs BP 113/81   Pulse (!) 104   Temp 98.6 F (37 C)   Resp 16   SpO2 100%  Physical Exam Vitals and nursing note reviewed.  Constitutional:      General: She is not in acute distress.    Appearance: She is well-developed.  HENT:     Head: Normocephalic and atraumatic.  Eyes:     Conjunctiva/sclera: Conjunctivae normal.  Cardiovascular:     Rate and Rhythm: Normal rate and regular rhythm.     Heart sounds: No murmur heard. Pulmonary:     Effort: Pulmonary effort is normal. No respiratory distress.     Breath sounds: Normal breath sounds.  Abdominal:     General: There is no distension.     Palpations: Abdomen is soft.     Tenderness: There is generalized abdominal tenderness. There is no right CVA tenderness or left CVA tenderness.  Musculoskeletal:        General: No swelling.     Cervical back: Neck supple.  Skin:    General: Skin is warm and dry.     Capillary Refill: Capillary refill takes less than 2 seconds.  Neurological:     Mental Status: She is alert.  Psychiatric:        Mood and Affect: Mood normal.     ED Results / Procedures / Treatments   Labs (all labs ordered are listed, but only abnormal results are displayed) Labs Reviewed  COMPREHENSIVE METABOLIC PANEL - Abnormal; Notable for the following components:      Result Value   Glucose, Bld 135 (*)    Creatinine, Ser 1.02 (*)    All other components within normal limits  CBC - Abnormal; Notable for the following components:   WBC 25.7 (*)    Hemoglobin 7.7 (*)    HCT 27.0 (*)    MCV 60.8 (*)    MCH 17.3 (*)    MCHC 28.5 (*)    RDW 20.9 (*)    All other components within  normal limits  URINALYSIS, ROUTINE W REFLEX MICROSCOPIC - Abnormal; Notable for the following components:   Color, Urine AMBER (*)    APPearance TURBID (*)    Hgb urine dipstick SMALL (*)    Protein, ur 100 (*)    Leukocytes,Ua LARGE (*)    WBC, UA >50 (*)    Bacteria, UA RARE (*)    Squamous Epithelial / LPF >50 (*)    All other components within normal limits  LIPASE, BLOOD  I-STAT BETA HCG BLOOD, ED ([facility], [facility], [facility] ONLY)    EKG None  Radiology US Pelvis Complete  Result Date: 03/30/2022 CLINICAL DATA:  Pelvic pain, abnormal findings in the CT done earlier today EXAM: TRANSABDOMINAL AND TRANSVAGINAL ULTRASOUND OF PELVIS DOPPLER ULTRASOUND OF OVARIES TECHNIQUE: Both transabdominal and transvaginal ultrasound examinations of the pelvis were performed. Transabdominal technique was performed for global imaging of the pelvis including uterus, ovaries, adnexal regions, and pelvic cul-de-sac. It was necessary to proceed with endovaginal exam following the transabdominal exam to visualize the endometrium and ovaries. Color and duplex Doppler ultrasound was utilized to evaluate blood flow to the ovaries. COMPARISON:  CT done earlier today FINDINGS: Uterus Measurements: 4.8 x 2.6 x 2.8 cm = volume: 17.7 mL. No fibroids or other mass visualized. Endometrium Thickness: 8 mm.  No focal abnormality visualized. Right ovary Measurements: 4.1 x 3 x 2.7 cm = volume: 17.2 mL. There is 4.6 by 2.8 x 3.5 cm complex cyst in right adnexa with internal septations and echogenic debris. Left ovary Measurements: 4.1 x 2.7 x 3 cm = volume: 17.2 mL. Normal appearance/no adnexal mass. Pulsed Doppler evaluation of both ovaries demonstrates normal low-resistance arterial and venous waveforms. Other findings No abnormal free fluid. IMPRESSION: There is no evidence of ovarian torsion.  Uterus is unremarkable. There is a complex cyst measuring 4.6 x 2.8 x 3.5 cm in the right adnexa with internal septations and debris. Findings may  suggest hemorrhagic ovarian cyst. Less likely possibility would be hydrosalpinx. Short-term follow-up pelvic sonogram in 1-2 months may be considered. Electronically Signed   By: Ernie Avena M.D.   On: 03/30/2022 19:37   US PELVIC COMPLETE W TRANSVAGINAL AND TORSION R/O  Result Date: 03/30/2022 CLINICAL DATA:  Pelvic pain, abnormal findings in the CT done earlier today EXAM: TRANSABDOMINAL AND TRANSVAGINAL ULTRASOUND OF PELVIS DOPPLER ULTRASOUND OF OVARIES TECHNIQUE: Both transabdominal and transvaginal ultrasound examinations of the pelvis were performed. Transabdominal technique was performed for global imaging of the pelvis including uterus, ovaries, adnexal regions, and pelvic cul-de-sac. It was necessary to proceed with endovaginal exam following the transabdominal exam to visualize the endometrium and ovaries. Color  and duplex Doppler ultrasound was utilized to evaluate blood flow to the ovaries. COMPARISON:  CT done earlier today FINDINGS: Uterus Measurements: 4.8 x 2.6 x 2.8 cm = volume: 17.7 mL. No fibroids or other mass visualized. Endometrium Thickness: 8 mm.  No focal abnormality visualized. Right ovary Measurements: 4.1 x 3 x 2.7 cm = volume: 17.2 mL. There is 4.6 by 2.8 x 3.5 cm complex cyst in right adnexa with internal septations and echogenic debris. Left ovary Measurements: 4.1 x 2.7 x 3 cm = volume: 17.2 mL. Normal appearance/no adnexal mass. Pulsed Doppler evaluation of both ovaries demonstrates normal low-resistance arterial and venous waveforms. Other findings No abnormal free fluid. IMPRESSION: There is no evidence of ovarian torsion.  Uterus is unremarkable. There is a complex cyst measuring 4.6 x 2.8 x 3.5 cm in the right adnexa with internal septations and debris. Findings may suggest hemorrhagic ovarian cyst. Less likely possibility would be hydrosalpinx. Short-term follow-up pelvic sonogram in 1-2 months may be considered. Electronically Signed   By: Ernie Avena M.D.    On: 03/30/2022 19:37   CT ABDOMEN PELVIS WO CONTRAST  Result Date: 03/30/2022 CLINICAL DATA:  Lower abdominal pain, back pain EXAM: CT ABDOMEN AND PELVIS WITHOUT CONTRAST TECHNIQUE: Multidetector CT imaging of the abdomen and pelvis was performed following the standard protocol without IV contrast. RADIATION DOSE REDUCTION: This exam was performed according to the departmental dose-optimization program which includes automated exposure control, adjustment of the mA and/or kV according to patient size and/or use of iterative reconstruction technique. COMPARISON:  01/06/2016 FINDINGS: Lower chest: Unremarkable. Hepatobiliary: No focal abnormalities are seen in liver. There is no dilation of bile ducts. Gallbladder is unremarkable. Pancreas: No focal abnormalities are seen. Spleen: Unremarkable. Adrenals/Urinary Tract: Adrenals are unremarkable. There is no hydronephrosis. There are no renal or ureteral stones. Urinary bladder is not distended. Stomach/Bowel: Stomach is unremarkable. Small bowel loops are not dilated. Appendix is difficult to visualize. In image 45 of series 6, there is a small caliber tubular structure in right side of pelvis, most likely normal appendix. There is no focal pericecal inflammation. There is no significant wall thickening in colon. There is no pericolic stranding. Vascular/Lymphatic: Unremarkable. Reproductive: Uterus is unremarkable. There is 7.8 x 5.4 cm fluid density structure anterior to the uterus. This structure could not be separated from the right adnexa. This may suggest ovarian cyst. Another less likely possibility would be fluid-filled bowel loop. Other: There is no ascites or pneumoperitoneum. Umbilical hernia containing fat is seen. Musculoskeletal: No acute findings are seen. IMPRESSION: There is no evidence of intestinal obstruction or pneumoperitoneum. There is no hydronephrosis. There is 7.8 x 5.4 cm fluid density structure anterior to the uterus extending to the  right adnexa. Findings suggest possible functional cyst in the right ovary. Follow-up pelvic sonogram with Doppler should be considered to evaluate this finding and to rule out the possibility of ovarian torsion. Electronically Signed   By: Ernie Avena M.D.   On: 03/30/2022 17:43    Procedures Procedures    Medications Ordered in ED Medications  morphine (PF) 4 MG/ML injection 4 mg (4 mg Intravenous Given 03/30/22 1528)  ondansetron (ZOFRAN) injection 4 mg (4 mg Intravenous Given 03/30/22 1527)    ED Course/ Medical Decision Making/ A&P                           Medical Decision Making Amount and/or Complexity of Data Reviewed Labs: ordered. Radiology: ordered.  Risk Prescription drug management.   This patient presents to the ED for concern of abdominal pain, this involves an extensive number of treatment options, and is a complaint that carries with it a high risk of complications and morbidity.  The differential diagnosis includes ruptured ovarian cyst, appendicitis, cholecystitis, gastritis, colitis, PID, TOA and others   Co morbidities that complicate the patient evaluation  History of ruptured ovarian cyst, previous sexually transmitted infections   Additional history obtained:  External records from outside source obtained and reviewed including emergency department notes from October 2017 for right upper quadrant pain.  Patient very similar presentation at that time with generalized abdominal pain, tender to palpation all over with some voluntary guarding.  Patient had elevated white count at that time of 29.9.  Patient was evaluated with a noncontrast CT abdomen pelvis due to iodine allergy.  No abnormalities on CT scan.  Right upper quadrant ultrasound ordered with no abnormalities.  Symptoms subsided on their own.   Lab Tests:  I Ordered, and personally interpreted labs.  The pertinent results include:  Negative I-stat beta hCG pregnancy test, UA appears  possibly contaminated with >50 squamous epithelial cells, WBC 25.7, hemoglobin 7.7   Imaging Studies ordered:  I ordered imaging studies including ct abdomen pelvis, pelvic ultrasound complete with transvaginal and torsion rule out  I independently visualized and interpreted imaging which showed  There is no evidence of intestinal obstruction or pneumoperitoneum.  There is no hydronephrosis.    There is 7.8 x 5.4 cm fluid density structure anterior to the uterus  extending to the right adnexa.   There is no evidence of ovarian torsion.  Uterus is unremarkable.    There is a complex cyst measuring 4.6 x 2.8 x 3.5 cm in the right  adnexa with internal septations and debris. Findings may suggest  hemorrhagic ovarian cyst. Less likely possibility would be  hydrosalpinx. Short-term follow-up pelvic sonogram in 1-2 months may  be considered.   I agree with the radiologist interpretation   Problem List / ED Course / Critical interventions / Medication management   I ordered medication including morphine for pain and zofran  for nausea  Reevaluation of the patient after these medicines showed that the patient improved I have reviewed the patients home medicines and have made adjustments as needed   Social Determinants of Health:  Patient currently has no PCP. Patient was recently approved for medicaid   Test / Admission - Considered:  The patient has findings suggestive of a hemorrhagic ovarian cyst.  Patient states this feels similar to previous ruptured ovarian cyst.  There is no indication at this time for admission.  Patient is hemodynamically stable.  Unclear etiology of patient's elevated white count at this time patient denies any sort of discharge, dysuria, or other findings suggestive of pelvic etiology.  No evidence of TOA on CT abdomen pelvis.  Plan to discharge patient home with plans for follow-up with OB/GYN and PCP.  Patient states that she has been assigned a PCP by her  Medicaid but has yet to have her first appointment.  Plan to prescribe short course of pain medication and anti-inflammatories.  Patient provided with return precautions.        Final Clinical Impression(s) / ED Diagnoses Final diagnoses:  Hemorrhagic cyst of right ovary    Rx / DC Orders ED Discharge Orders          Ordered    HYDROcodone-acetaminophen (NORCO/VICODIN) 5-325 MG tablet  Every 4 hours  PRN        03/30/22 2044    naproxen (NAPROSYN) 500 MG tablet  2 times daily        03/30/22 2044              Pamala DuffelMcCauley, Carlena Ruybal B, PA-C 03/30/22 2058

## 2022-03-30 NOTE — ED Triage Notes (Signed)
Patient complains of lower abdominal pain and lower back pain that started yesterday. Patient reports her symptoms now feel similar to when she previously had a ruptured ovarian cyst. Patient is alert, oriented, and in no apparent distress at this time.

## 2022-03-30 NOTE — ED Notes (Signed)
AVS reviewed with pt prior to discharge. Pt verbalizes understanding of teaching. Belongings with pt upon depart. Ambulatory to POV with friend.

## 2022-03-30 NOTE — Discharge Instructions (Addendum)
You were evaluated today for abdominal pain. Your ultrasound shows signs of a possible ruptured ovarian cyst. I have prescribed antiinflammatory medication and pain medication. Please follow up with primary care for evaluation of your anemia. Please follow up with OB/GYN for further evaluation of your cyst. You may need a repeat ultrasound in the future for re-evaluation.  If you develop shortness of breath, severe bleeding, or other life threatening conditions please return to the emergency department

## 2022-03-30 NOTE — ED Provider Triage Note (Signed)
Emergency Medicine Provider Triage Evaluation Note  Rachel Lamb , a 26 y.o. female  was evaluated in triage.  Pt complains of abd pain. Pain to lower abd radiates to back since yesterday and felt similar to prior ruptured ovarian cyst.  LMP a week ago.  Endorse nausea and vomiting.  No fever, chills, dysuria, vaginal bleeding, vaginal discharge  Review of Systems  Positive: As above Negative: As above  Physical Exam  BP 113/81   Pulse (!) 104   Temp 98.6 F (37 C)   Resp 16   SpO2 100%  Gen:   Awake, no distress   Resp:  Normal effort  MSK:   Moves extremities without difficulty  Other:    Medical Decision Making  Medically screening exam initiated at 2:44 PM.  Appropriate orders placed.  Rachel Lamb was informed that the remainder of the evaluation will be completed by another provider, this initial triage assessment does not replace that evaluation, and the importance of remaining in the ED until their evaluation is complete.     Fayrene Helper, PA-C 03/30/22 1447

## 2023-11-02 ENCOUNTER — Other Ambulatory Visit: Payer: Self-pay

## 2023-11-02 ENCOUNTER — Encounter (HOSPITAL_BASED_OUTPATIENT_CLINIC_OR_DEPARTMENT_OTHER): Payer: Self-pay | Admitting: Emergency Medicine

## 2023-11-02 DIAGNOSIS — H11431 Conjunctival hyperemia, right eye: Secondary | ICD-10-CM | POA: Insufficient documentation

## 2023-11-02 DIAGNOSIS — H5711 Ocular pain, right eye: Secondary | ICD-10-CM | POA: Diagnosis present

## 2023-11-02 DIAGNOSIS — H18891 Other specified disorders of cornea, right eye: Secondary | ICD-10-CM | POA: Insufficient documentation

## 2023-11-02 NOTE — ED Triage Notes (Signed)
 Pt thinks she has a stye on RT eyelid since this morning; painful to try to open her eye

## 2023-11-03 ENCOUNTER — Emergency Department (HOSPITAL_BASED_OUTPATIENT_CLINIC_OR_DEPARTMENT_OTHER)
Admission: EM | Admit: 2023-11-03 | Discharge: 2023-11-03 | Disposition: A | Discharge status: Home / Self Care | Attending: Emergency Medicine | Admitting: Emergency Medicine

## 2023-11-03 DIAGNOSIS — H5711 Ocular pain, right eye: Secondary | ICD-10-CM

## 2023-11-03 LAB — BASIC METABOLIC PANEL WITH GFR
Anion gap: 15 (ref 5–15)
BUN: 12 mg/dL (ref 6–20)
CO2: 22 mmol/L (ref 22–32)
Calcium: 9.9 mg/dL (ref 8.9–10.3)
Chloride: 102 mmol/L (ref 98–111)
Creatinine, Ser: 0.8 mg/dL (ref 0.44–1.00)
GFR, Estimated: >60 mL/min (ref >60)
Glucose, Bld: 99 mg/dL (ref 70–99)
Potassium: 4.5 mmol/L (ref 3.5–5.1)
Sodium: 139 mmol/L (ref 135–145)

## 2023-11-03 LAB — CBC WITH DIFFERENTIAL/PLATELET
Abs Immature Granulocytes: 0.02 K/uL (ref 0.00–0.07)
Basophils Absolute: 0 K/uL (ref 0.0–0.1)
Basophils Relative: 1 %
Eosinophils Absolute: 0.2 K/uL (ref 0.0–0.5)
Eosinophils Relative: 3 %
HCT: 28.6 % — ABNORMAL LOW (ref 36.0–46.0)
Hemoglobin: 7.8 g/dL — ABNORMAL LOW (ref 12.0–15.0)
Immature Granulocytes: 0 %
Lymphocytes Relative: 20 %
Lymphs Abs: 1.3 K/uL (ref 0.7–4.0)
MCH: 15.3 pg — ABNORMAL LOW (ref 26.0–34.0)
MCHC: 27.3 g/dL — ABNORMAL LOW (ref 30.0–36.0)
MCV: 56.2 fL — ABNORMAL LOW (ref 80.0–100.0)
Monocytes Absolute: 0.5 K/uL (ref 0.1–1.0)
Monocytes Relative: 7 %
Neutro Abs: 4.5 K/uL (ref 1.7–7.7)
Neutrophils Relative %: 69 %
Platelets: 435 K/uL — ABNORMAL HIGH (ref 150–400)
RBC: 5.09 MIL/uL (ref 3.87–5.11)
RDW: 22.6 % — ABNORMAL HIGH (ref 11.5–15.5)
WBC: 6.5 K/uL (ref 4.0–10.5)
nRBC: 0 % (ref 0.0–0.2)

## 2023-11-03 MED ORDER — TOBRAMYCIN 0.3 % OP SOLN: 2.0000 [drp] | Freq: Four times a day (QID) | OPHTHALMIC | 0 refills | Status: AC

## 2023-11-03 MED ORDER — KETOROLAC TROMETHAMINE 15 MG/ML IJ SOLN
15.0000 mg | Freq: Once | INTRAMUSCULAR | Status: AC
  Administered 2023-11-03: 15 mg via INTRAVENOUS
  Filled 2023-11-03: qty 1

## 2023-11-03 MED ORDER — FLUORESCEIN SODIUM 1 MG OP STRP
1.0000 | ORAL_STRIP | Freq: Once | OPHTHALMIC | Status: AC
  Administered 2023-11-03: 1 via OPHTHALMIC
  Filled 2023-11-03: qty 1

## 2023-11-03 MED ORDER — CYCLOPENTOLATE HCL 1 % OP SOLN: 1.0000 [drp] | Freq: Four times a day (QID) | OPHTHALMIC | 0 refills | Status: AC

## 2023-11-03 MED ORDER — TETRACAINE HCL 0.5 % OP SOLN
1.0000 [drp] | Freq: Once | OPHTHALMIC | Status: AC
  Filled 2023-11-03: qty 4

## 2023-11-03 MED ORDER — KETOROLAC TROMETHAMINE 0.5 % OP SOLN: 1.0000 [drp] | Freq: Four times a day (QID) | OPHTHALMIC | 0 refills | Status: AC

## 2023-11-03 NOTE — ED Provider Notes (Signed)
 [place] EMERGENCY DEPARTMENT AT [facility] Provider Note  CSN: [id number] Arrival date & time: 11/02/23 2245  Chief Complaint(s) Eye Pain  HPI Rachel Lamb is a 28 y.o. female     Eye Pain This is a new problem. The current episode started 12 to 24 hours ago. The problem occurs constantly. The problem has been gradually worsening. Pertinent negatives include no chest pain, no abdominal pain, no headaches and no shortness of breath. Exacerbated by: eye movement, light. Nothing relieves the symptoms.   Reports that she wears contacts, but removes them nightly. No FB. No Trauma. Reports blurry vision.  Past Medical History Past Medical History:  Diagnosis Date   Chlamydia    Gonorrhea    There are no active problems to display for this patient.  Home Medication(s) Prior to Admission medications   Medication Sig Start Date End Date Taking? Authorizing Provider  cyclopentolate  (CYCLODRYL,CYCLOGYL ) 1 % ophthalmic solution Place 1 drop into the right eye in the morning, at noon, in the evening, and at bedtime. 11/03/23  Yes Levi Crass, Raynell Moder, MD  ketorolac  (ACULAR ) 0.5 % ophthalmic solution Place 1 drop into both eyes every 6 (six) hours for 3 days. 11/03/23 11/06/23 Yes Ladislaus Repsher, Raynell Moder, MD  tobramycin  (TOBREX ) 0.3 % ophthalmic solution Place 2 drops into the right eye every 6 (six) hours for 5 days. 11/03/23 11/08/23 Yes Tyliek Timberman, Raynell Moder, MD  HYDROcodone -acetaminophen  (NORCO/VICODIN) 5-325 MG tablet Take 1 tablet by mouth every 4 (four) hours as needed. 03/30/22   Logan Ubaldo NOVAK, PA-C  medroxyPROGESTERone  (PROVERA ) 10 MG tablet Take 1 tablet (10 mg total) by mouth daily. 12/17/17   Babara, Amy V, PA-C  metroNIDAZOLE  (FLAGYL ) 500 MG tablet Take 1 tablet (500 mg total) by mouth 2 (two) times daily. 11/26/19   Lamptey, Aleene KIDD, MD  naproxen  (NAPROSYN ) 500 MG tablet Take 1 tablet (500 mg total) by mouth 2 (two) times daily. 02/04/20   Burky, Natalie B, NP  naproxen   (NAPROSYN ) 500 MG tablet Take 1 tablet (500 mg total) by mouth 2 (two) times daily. 03/30/22   Logan Ubaldo NOVAK, PA-C                                                                                                                                    Allergies Iodine  Review of Systems Review of Systems  Eyes:  Positive for pain.  Respiratory:  Negative for shortness of breath.   Cardiovascular:  Negative for chest pain.  Gastrointestinal:  Negative for abdominal pain.  Neurological:  Negative for headaches.   As noted in HPI  Physical Exam Vital Signs  I have reviewed the triage vital signs BP 125/89 (BP Location: Left Arm)   Pulse 69   Temp 98.7 F (37.1 C) (Oral)   Resp 16   Ht 5' (1.524 m)   Wt 59 kg   SpO2 100%   BMI 25.39 kg/m  Physical Exam Vitals reviewed.  Constitutional:      General: She is not in acute distress.    Appearance: She is well-developed. She is not diaphoretic.  HENT:     Head: Normocephalic and atraumatic.     Right Ear: External ear normal.     Left Ear: External ear normal.     Nose: Nose normal.  Eyes:     General: No scleral icterus.       Right eye: No foreign body, discharge or hordeolum.        Left eye: No discharge or hordeolum.     Intraocular pressure: Right eye pressure is 20 mmHg. Measurements were taken using an automated tonometer.    Extraocular Movements: Extraocular movements intact.     Conjunctiva/sclera:     Right eye: Right conjunctiva is injected. No exudate or hemorrhage.    Pupils:     Right eye: Pupil is round, reactive and not sluggish. No corneal abrasion or fluorescein  uptake. Seidel exam negative.     Slit lamp exam:    Right eye: Photophobia present. No hyphema or hypopyon.     Comments: Hazy cornea on right in semicircular distribution from 3-7 o'clock.  Neck:     Trachea: Phonation normal.  Cardiovascular:     Rate and Rhythm: Normal rate and regular rhythm.  Pulmonary:     Effort: Pulmonary effort is  normal. No respiratory distress.     Breath sounds: No stridor.  Abdominal:     General: There is no distension.  Musculoskeletal:        General: Normal range of motion.     Cervical back: Normal range of motion.  Neurological:     Mental Status: She is alert and oriented to person, place, and time.  Psychiatric:        Behavior: Behavior normal.     ED Results and Treatments Labs (all labs ordered are listed, but only abnormal results are displayed) Labs Reviewed  CBC WITH DIFFERENTIAL/PLATELET - Abnormal; Notable for the following components:      Result Value   Hemoglobin 7.8 (*)    HCT 28.6 (*)    MCV 56.2 (*)    MCH 15.3 (*)    MCHC 27.3 (*)    RDW 22.6 (*)    Platelets 435 (*)    All other components within normal limits  BASIC METABOLIC PANEL WITH GFR                                                                                                                         EKG  EKG Interpretation Date/Time:    Ventricular Rate:    PR Interval:    QRS Duration:    QT Interval:    QTC Calculation:   R Axis:      Text Interpretation:         Radiology No results found.  Medications Ordered in ED Medications  fluorescein  ophthalmic strip 1 strip (1 strip  Right Eye Given by Other 11/03/23 0220)  tetracaine  (PONTOCAINE) 0.5 % ophthalmic solution 1-2 drop ( Right Eye Given by Other 11/03/23 0155)  ketorolac  (TORADOL ) 15 MG/ML injection 15 mg (15 mg Intravenous Given 11/03/23 0256)   Procedures Procedures  (including critical care time) Medical Decision Making / ED Course   Medical Decision Making Amount and/or Complexity of Data Reviewed Labs: ordered. Decision-making details documented in ED Course.  Risk Prescription drug management.    Right eye pain Exam not consistent with acute glaucoma. Possible infection related to contact use. No abrasion or FB noted. Would also like to rule out optic neuritis. Discussed transfer to [facility] for [facility], but patient  declined stating that she would return.  Encouraged to do so and also follow up with ophtho.    Final Clinical Impression(s) / ED Diagnoses Final diagnoses:  Pain of right eye   The patient appears reasonably screened and/or stabilized for discharge and I doubt any other medical condition or other [facility] requiring further screening, evaluation, or treatment in the ED at this time. I have discussed the findings, Dx and Tx plan with the patient/family who expressed understanding and agree(s) with the plan. Discharge instructions discussed at length. The patient/family was given strict return precautions who verbalized understanding of the instructions. No further questions at time of discharge.  Disposition: Discharge  Condition: Good  ED Discharge Orders          Ordered    tobramycin  (TOBREX ) 0.3 % ophthalmic solution  Every 6 hours        11/03/23 0352    cyclopentolate  (CYCLODRYL,CYCLOGYL ) 1 % ophthalmic solution  4 times daily        11/03/23 0352    ketorolac  (ACULAR ) 0.5 % ophthalmic solution  Every 6 hours        11/03/23 9647              Follow Up: Charmayne Molly, MD [address] [place] [address] [phone]  Call  to schedule an appointment for close follow up     This chart was dictated using voice recognition software.  Despite best efforts to proofread,  errors can occur which can change the documentation meaning.    Trine Raynell Moder, MD 11/03/23 [phone]

## 2023-11-03 NOTE — Discharge Instructions (Signed)
 Please follow-up with ophthalmology.  You may also need to return to the emergency department for MRI to rule out optic neuritis which could be a symptom of multiple sclerosis.
